# Patient Record
Sex: Female | Born: 1985 | Race: White | Hispanic: No | Marital: Married | Smoking: Never smoker
Health system: Southern US, Community
[De-identification: ages and names within clinical notes are randomized; demographics above are authoritative.]

## PROBLEM LIST (undated history)

## (undated) ENCOUNTER — Inpatient Hospital Stay (HOSPITAL_COMMUNITY): Payer: Self-pay

## (undated) DIAGNOSIS — Z789 Other specified health status: Secondary | ICD-10-CM

---

## 2013-07-14 ENCOUNTER — Other Ambulatory Visit (HOSPITAL_COMMUNITY): Payer: Self-pay | Admitting: Physician Assistant

## 2013-07-14 DIAGNOSIS — O26849 Uterine size-date discrepancy, unspecified trimester: Secondary | ICD-10-CM

## 2013-07-14 LAB — OB RESULTS CONSOLE ABO/RH: RH Type: POSITIVE

## 2013-07-14 LAB — OB RESULTS CONSOLE GC/CHLAMYDIA
Chlamydia: NEGATIVE
Gonorrhea: NEGATIVE

## 2013-07-14 LAB — OB RESULTS CONSOLE HIV ANTIBODY (ROUTINE TESTING): HIV: NONREACTIVE

## 2013-07-14 LAB — OB RESULTS CONSOLE ANTIBODY SCREEN: Antibody Screen: NEGATIVE

## 2013-07-14 LAB — OB RESULTS CONSOLE HEPATITIS B SURFACE ANTIGEN: Hepatitis B Surface Ag: NEGATIVE

## 2013-07-14 LAB — OB RESULTS CONSOLE RUBELLA ANTIBODY, IGM: RUBELLA: IMMUNE

## 2013-07-14 LAB — OB RESULTS CONSOLE RPR: RPR: NONREACTIVE

## 2013-07-15 ENCOUNTER — Other Ambulatory Visit (HOSPITAL_COMMUNITY): Payer: Self-pay | Admitting: Physician Assistant

## 2013-07-15 DIAGNOSIS — R1011 Right upper quadrant pain: Secondary | ICD-10-CM

## 2013-07-20 ENCOUNTER — Ambulatory Visit (HOSPITAL_COMMUNITY): Payer: Self-pay

## 2013-07-21 ENCOUNTER — Other Ambulatory Visit (HOSPITAL_COMMUNITY): Payer: Self-pay | Admitting: Physician Assistant

## 2013-07-21 DIAGNOSIS — Z3682 Encounter for antenatal screening for nuchal translucency: Secondary | ICD-10-CM

## 2013-07-25 ENCOUNTER — Ambulatory Visit (HOSPITAL_COMMUNITY): Payer: Medicaid Other

## 2013-07-25 ENCOUNTER — Ambulatory Visit (HOSPITAL_COMMUNITY)
Admission: RE | Admit: 2013-07-25 | Discharge: 2013-07-25 | Disposition: A | Discharge status: Home / Self Care | Payer: Medicaid Other | Source: Ambulatory Visit | Attending: Physician Assistant | Admitting: Physician Assistant

## 2013-07-25 ENCOUNTER — Encounter (HOSPITAL_COMMUNITY): Payer: Self-pay

## 2013-07-25 ENCOUNTER — Other Ambulatory Visit (HOSPITAL_COMMUNITY): Payer: Self-pay | Admitting: Physician Assistant

## 2013-07-25 DIAGNOSIS — O34219 Maternal care for unspecified type scar from previous cesarean delivery: Secondary | ICD-10-CM | POA: Insufficient documentation

## 2013-07-25 DIAGNOSIS — Z3689 Encounter for other specified antenatal screening: Secondary | ICD-10-CM | POA: Insufficient documentation

## 2013-07-25 DIAGNOSIS — Z3682 Encounter for antenatal screening for nuchal translucency: Secondary | ICD-10-CM

## 2013-07-25 DIAGNOSIS — O09299 Supervision of pregnancy with other poor reproductive or obstetric history, unspecified trimester: Secondary | ICD-10-CM | POA: Insufficient documentation

## 2013-07-25 DIAGNOSIS — O3660X0 Maternal care for excessive fetal growth, unspecified trimester, not applicable or unspecified: Secondary | ICD-10-CM | POA: Insufficient documentation

## 2013-08-09 ENCOUNTER — Encounter (HOSPITAL_COMMUNITY): Payer: Self-pay

## 2013-08-11 ENCOUNTER — Ambulatory Visit (HOSPITAL_COMMUNITY)
Admission: RE | Admit: 2013-08-11 | Discharge: 2013-08-11 | Disposition: A | Discharge status: Home / Self Care | Payer: Medicaid Other | Source: Ambulatory Visit | Attending: Physician Assistant | Admitting: Physician Assistant

## 2013-08-11 DIAGNOSIS — R1011 Right upper quadrant pain: Secondary | ICD-10-CM

## 2013-08-18 ENCOUNTER — Other Ambulatory Visit (HOSPITAL_COMMUNITY): Payer: Self-pay | Admitting: Family

## 2013-08-18 DIAGNOSIS — Z0489 Encounter for examination and observation for other specified reasons: Secondary | ICD-10-CM

## 2013-08-22 ENCOUNTER — Ambulatory Visit (HOSPITAL_COMMUNITY): Admission: RE | Admit: 2013-08-22 | Payer: Medicaid Other | Source: Ambulatory Visit

## 2013-08-22 ENCOUNTER — Ambulatory Visit (HOSPITAL_COMMUNITY): Payer: Medicaid Other

## 2013-08-26 ENCOUNTER — Other Ambulatory Visit (HOSPITAL_COMMUNITY): Payer: Self-pay | Admitting: Family

## 2013-08-26 ENCOUNTER — Ambulatory Visit (HOSPITAL_COMMUNITY): Admission: RE | Admit: 2013-08-26 | Payer: Medicaid Other | Source: Ambulatory Visit

## 2013-08-26 ENCOUNTER — Ambulatory Visit (HOSPITAL_COMMUNITY)
Admission: RE | Admit: 2013-08-26 | Discharge: 2013-08-26 | Disposition: A | Discharge status: Home / Self Care | Payer: Medicaid Other | Source: Ambulatory Visit | Attending: Family | Admitting: Family

## 2013-08-26 DIAGNOSIS — Z3689 Encounter for other specified antenatal screening: Secondary | ICD-10-CM | POA: Insufficient documentation

## 2013-08-26 DIAGNOSIS — O34219 Maternal care for unspecified type scar from previous cesarean delivery: Secondary | ICD-10-CM | POA: Insufficient documentation

## 2013-08-26 DIAGNOSIS — O09299 Supervision of pregnancy with other poor reproductive or obstetric history, unspecified trimester: Secondary | ICD-10-CM | POA: Insufficient documentation

## 2013-08-26 DIAGNOSIS — Z0489 Encounter for examination and observation for other specified reasons: Secondary | ICD-10-CM

## 2013-08-26 DIAGNOSIS — O3660X0 Maternal care for excessive fetal growth, unspecified trimester, not applicable or unspecified: Secondary | ICD-10-CM | POA: Insufficient documentation

## 2013-08-29 ENCOUNTER — Emergency Department (HOSPITAL_COMMUNITY)
Admission: EM | Admit: 2013-08-29 | Discharge: 2013-08-29 | Disposition: A | Discharge status: Home / Self Care | Payer: Medicaid Other | Source: Home / Self Care | Attending: Emergency Medicine | Admitting: Emergency Medicine

## 2013-08-29 ENCOUNTER — Encounter (HOSPITAL_COMMUNITY): Payer: Self-pay | Admitting: Emergency Medicine

## 2013-08-29 DIAGNOSIS — J069 Acute upper respiratory infection, unspecified: Secondary | ICD-10-CM

## 2013-08-29 MED ORDER — AZITHROMYCIN 250 MG PO TABS: ORAL_TABLET | ORAL | Status: DC

## 2013-08-29 MED ORDER — LORATADINE-PSEUDOEPHEDRINE ER 10-240 MG PO TB24: 1.0000 | ORAL_TABLET | Freq: Every day | ORAL | Status: DC

## 2013-08-29 NOTE — ED Notes (Signed)
C/o cough and congestion for five days now Denies any fever, chills, diarrhea, and constipation States the mucous is brownish  Patient is pregnant

## 2013-08-29 NOTE — ED Provider Notes (Signed)
Chief Complaint:   Chief Complaint  Patient presents with  . Cough  . Nasal Congestion    History of Present Illness:   Monica Bean is a 27 year old female who is 4 months pregnant. She has had a four-day history of a cough productive of brown sputum, chest tightness, aching in her ribs, nasal congestion, and right ear discomfort. She denies any fever, chills, shortness of breath, sore throat, or GI symptoms. There have been no pregnancy complications.  Review of Systems:  Other than noted above, the patient denies any of the following symptoms: Systemic:  No fevers, chills, sweats, weight loss or gain, fatigue, or tiredness. Eye:  No redness or discharge. ENT:  No ear pain, drainage, headache, nasal congestion, drainage, sinus pressure, difficulty swallowing, or sore throat. Neck:  No neck pain or swollen glands. Lungs:  No cough, sputum production, hemoptysis, wheezing, chest tightness, shortness of breath or chest pain. GI:  No abdominal pain, nausea, vomiting or diarrhea.  PMFSH:  Past medical history, family history, social history, meds, and allergies were reviewed.   Physical Exam:   Vital signs:  BP 99/66  Pulse 90  Temp(Src) 97.7 F (36.5 C) (Oral)  Resp 16  SpO2 100% General:  Alert and oriented.  In no distress.  Skin warm and dry. Eye:  No conjunctival injection or drainage. Lids were normal. ENT:  TMs and canals were normal, without erythema or inflammation.  Nasal mucosa was clear and uncongested, without drainage.  Mucous membranes were moist.  Pharynx was clear with no exudate or drainage.  There were no oral ulcerations or lesions. Neck:  Supple, no adenopathy, tenderness or mass. Lungs:  No respiratory distress.  Lungs were clear to auscultation, without wheezes, rales or rhonchi.  Breath sounds were clear and equal bilaterally.  Heart:  Regular rhythm, without gallops, murmers or rubs. Skin:  Clear, warm, and dry, without rash or lesions.  Assessment:  The  encounter diagnosis was Viral upper respiratory infection.  Plan:   1.  Meds:  The following meds were prescribed:   Discharge Medication List as of 08/29/2013  2:14 PM    START taking these medications   Details  azithromycin (ZITHROMAX Z-PAK) 250 MG tablet Take as directed., Print    loratadine-pseudoephedrine (CLARITIN-D 24 HOUR) 10-240 MG per 24 hr tablet Take 1 tablet by mouth daily., Starting 08/29/2013, Until Discontinued, Normal        2.  Patient Education/Counseling:  The patient was given appropriate handouts, self care instructions, and instructed in symptomatic relief.  For right now can use Claritin-D, throat lozenges, and hot salt water gargles as well as saline nasal spray. If no better in 2-3 days, may start a Z-Pak.  3.  Follow up:  The patient was told to follow up if no better in 3 to 4 days, if becoming worse in any way, and given some red flag symptoms such as fever or difficulty breathing which would prompt immediate return.  Follow up here as necessary.      Reuben Likes, MD 08/29/13 [phone]

## 2013-09-29 NOTE — L&D Delivery Note (Signed)
[facility]  Labor Events:   Preterm labor:   Rupture date:   Rupture time:   Rupture type: Artificial  Fluid Color: Light Meconium  Induction:   Augmentation:   Complications:   Cervical ripening:          Delivery:   Episiotomy:   Lacerations:   Repair suture:   Repair # of packets:   Blood loss (ml): 400   Information for the patient's newborn:  Monica Bean, Girl [name] [[id number]][[id number]]    Delivery 01/19/2014 6:13 PM by  VBAC, Vacuum Assisted Sex:  female Gestational Age: <None> Delivery Clinician:  Adam PhenixJames G Hope Holst Living?: Yes        APGARS  One minute Five minutes Ten minutes  Skin color: 0   1      Heart rate: 2   2      Grimace: 1   1      Muscle tone: 0   1      Breathing: 0   2      Totals: 3  7      Presentation/position: Vertex  Left Occiput Anterior Resuscitation: See NICU Consult (infant's chart)  Cord information: 3 vessels   Disposition of cord blood: No    Blood gases sent? Yes Complications: None  Placenta: Delivered: 01/19/2014 6:47 PM  Spontaneous  Intact appearance Newborn Measurements: Weight:   Height: 23.23"  Head circumference:   Chest circumference:   Other providers: Delivery Nurse Registered Nurse Registered Nurse Registered Nurse Technician Tobias AlexanderHeather Jensen Krietemeyer Melissa A Wilkins Doloris Hallhristine Marie Soliz Shannon Kaley Earl Audrey Elaine Edwards  Additional  information: Forceps:   Vacuum: Outlet  Breech:   Observed anomalies       Kiwi vacuum, 2 pop-off. 3rd degree laceration repair 3-0 vicryl. Patient counseled prior to vacuum attempt and gave verbal consent. Weight 8 lb 11.3 oz Bottle feed  Adam PhenixJames G Larnell Granlund, MD 01/19/2014 7:17 PM

## 2013-10-13 ENCOUNTER — Other Ambulatory Visit: Payer: Self-pay | Admitting: Family Medicine

## 2013-10-13 DIAGNOSIS — N63 Unspecified lump in unspecified breast: Secondary | ICD-10-CM

## 2013-11-01 ENCOUNTER — Ambulatory Visit
Admission: RE | Admit: 2013-11-01 | Discharge: 2013-11-01 | Disposition: A | Discharge status: Home / Self Care | Payer: Medicaid Other | Source: Ambulatory Visit | Attending: Family Medicine | Admitting: Family Medicine

## 2013-11-01 DIAGNOSIS — N63 Unspecified lump in unspecified breast: Secondary | ICD-10-CM

## 2013-12-19 ENCOUNTER — Other Ambulatory Visit (HOSPITAL_COMMUNITY): Payer: Self-pay | Admitting: Nurse Practitioner

## 2013-12-19 DIAGNOSIS — O321XX0 Maternal care for breech presentation, not applicable or unspecified: Secondary | ICD-10-CM

## 2013-12-21 LAB — OB RESULTS CONSOLE GBS: GBS: NEGATIVE

## 2013-12-23 ENCOUNTER — Ambulatory Visit (HOSPITAL_COMMUNITY)
Admission: RE | Admit: 2013-12-23 | Discharge: 2013-12-23 | Disposition: A | Discharge status: Home / Self Care | Payer: Medicaid Other | Source: Ambulatory Visit | Attending: Nurse Practitioner | Admitting: Nurse Practitioner

## 2013-12-23 DIAGNOSIS — O34219 Maternal care for unspecified type scar from previous cesarean delivery: Secondary | ICD-10-CM | POA: Insufficient documentation

## 2013-12-23 DIAGNOSIS — Z363 Encounter for antenatal screening for malformations: Secondary | ICD-10-CM | POA: Insufficient documentation

## 2013-12-23 DIAGNOSIS — Z1389 Encounter for screening for other disorder: Secondary | ICD-10-CM | POA: Insufficient documentation

## 2013-12-23 DIAGNOSIS — O3660X0 Maternal care for excessive fetal growth, unspecified trimester, not applicable or unspecified: Secondary | ICD-10-CM | POA: Insufficient documentation

## 2013-12-23 DIAGNOSIS — O321XX0 Maternal care for breech presentation, not applicable or unspecified: Secondary | ICD-10-CM

## 2014-01-16 ENCOUNTER — Inpatient Hospital Stay (HOSPITAL_COMMUNITY): Payer: Medicaid Other

## 2014-01-16 ENCOUNTER — Inpatient Hospital Stay (HOSPITAL_COMMUNITY)
Admission: AD | Admit: 2014-01-16 | Discharge: 2014-01-16 | Disposition: A | Discharge status: Home / Self Care | Payer: Medicaid Other | Source: Ambulatory Visit | Attending: Obstetrics & Gynecology | Admitting: Obstetrics & Gynecology

## 2014-01-16 ENCOUNTER — Encounter (HOSPITAL_COMMUNITY): Payer: Self-pay | Admitting: *Deleted

## 2014-01-16 DIAGNOSIS — R748 Abnormal levels of other serum enzymes: Secondary | ICD-10-CM

## 2014-01-16 DIAGNOSIS — O9989 Other specified diseases and conditions complicating pregnancy, childbirth and the puerperium: Secondary | ICD-10-CM | POA: Insufficient documentation

## 2014-01-16 DIAGNOSIS — O99719 Diseases of the skin and subcutaneous tissue complicating pregnancy, unspecified trimester: Secondary | ICD-10-CM

## 2014-01-16 DIAGNOSIS — L299 Pruritus, unspecified: Secondary | ICD-10-CM | POA: Insufficient documentation

## 2014-01-16 DIAGNOSIS — R21 Rash and other nonspecific skin eruption: Secondary | ICD-10-CM | POA: Insufficient documentation

## 2014-01-16 HISTORY — DX: Other specified health status: Z78.9

## 2014-01-16 LAB — COMPREHENSIVE METABOLIC PANEL
ALT: 65 U/L — AB (ref 0–35)
AST: 39 U/L — AB (ref 0–37)
Albumin: 2.4 g/dL — ABNORMAL LOW (ref 3.5–5.2)
Alkaline Phosphatase: 242 U/L — ABNORMAL HIGH (ref 39–117)
BUN: 8 mg/dL (ref 6–23)
CO2: 20 meq/L (ref 19–32)
CREATININE: 0.57 mg/dL (ref 0.50–1.10)
Calcium: 9.4 mg/dL (ref 8.4–10.5)
Chloride: 105 mEq/L (ref 96–112)
GFR calc Af Amer: >90 mL/min (ref >90)
GLUCOSE: 92 mg/dL (ref 70–99)
Potassium: 4.5 mEq/L (ref 3.7–5.3)
SODIUM: 139 meq/L (ref 137–147)
Total Bilirubin: 0.5 mg/dL (ref 0.3–1.2)
Total Protein: 6.1 g/dL (ref 6.0–8.3)

## 2014-01-16 LAB — CBC WITH DIFFERENTIAL/PLATELET
Basophils Absolute: 0 10*3/uL (ref 0.0–0.1)
Basophils Relative: 0 % (ref 0–1)
EOS PCT: 1 % (ref 0–5)
Eosinophils Absolute: 0.1 10*3/uL (ref 0.0–0.7)
HEMATOCRIT: 38.8 % (ref 36.0–46.0)
Hemoglobin: 13.4 g/dL (ref 12.0–15.0)
LYMPHS ABS: 3.4 10*3/uL (ref 0.7–4.0)
LYMPHS PCT: 28 % (ref 12–46)
MCH: 30.7 pg (ref 26.0–34.0)
MCHC: 34.5 g/dL (ref 30.0–36.0)
MCV: 89 fL (ref 78.0–100.0)
MONO ABS: 0.9 10*3/uL (ref 0.1–1.0)
Monocytes Relative: 7 % (ref 3–12)
NEUTROS ABS: 7.9 10*3/uL — AB (ref 1.7–7.7)
Neutrophils Relative %: 64 % (ref 43–77)
Platelets: 278 10*3/uL (ref 150–400)
RBC: 4.36 MIL/uL (ref 3.87–5.11)
RDW: 14.2 % (ref 11.5–15.5)
WBC: 12.3 10*3/uL — AB (ref 4.0–10.5)

## 2014-01-16 MED ORDER — HYDROXYZINE PAMOATE 50 MG PO CAPS: 50.0000 mg | ORAL_CAPSULE | Freq: Three times a day (TID) | ORAL | Status: AC | PRN

## 2014-01-16 NOTE — MAU Note (Signed)
Patient presents with complaint of a rash covering her body X 2 weeks that itches.

## 2014-01-16 NOTE — Discharge Instructions (Signed)
Cholestasis of Pregnancy  Cholestasis refers to any condition where the flow of a digestive fluid (bile) in the gallbladder slows or stops. Cholestasis can develop during pregnancy because it is thought that pregnancy hormones affect how the gallbladder functions. It usually develops during the third trimester of pregnancy, but it can occur during any trimester. Often, it goes away soon after the baby is born. For the mother, it is usually uncomfortable but harmless. However, it can increase the risk for fetal distress, preterm birth, or fetal death. Early delivery may be recommended.   CAUSES   It is thought that pregnancy hormones may be the cause since the hormones may affect how the gallbladder functions.   SYMPTOMS   · Intense itching, especially on the palms of the hands and soles of the feet. Itching can spread to the trunk of the body.  · Rash.    · Yellowing of the skin and eyes (jaundice).    · Dark urine.    · Light-colored stools.    DIAGNOSIS   Your health care provider will take your history and perform a physical exam. Blood tests may be performed to check your liver function, check your level of bile salts, and check for the chemical bilirubin. An ultrasonography (commonly called ultrasound) of your liver may be done to check for abnormalities.   TREATMENT   A medicine may be given to control the itching and help the liver to function normally because bile is produced by the liver. If the problem is severe and the jaundice reaches dangerous levels, labor may be induced or a cesarean delivery may be performed to prevent serious medical problems for the baby.   HOME CARE INSTRUCTIONS   · Only take medicines or use anti-itch creams as directed by your health care provider.    · Take a cool bath and add 2 3 tbs (30 45 g) of cornstarch to the water to soothe the itching or rash.    · Keep all follow-up appointments as directed by your health care provider.    · Keep your fingernails short to prevent skin  irritations caused from scratching.    SEEK MEDICAL CARE IF:   Your symptoms get worse even with treatment.  Document Released: 09/12/2000 Document Revised: 05/18/2013 Document Reviewed: 01/03/2013  ExitCare® Patient Information ©2014 [employer].

## 2014-01-16 NOTE — MAU Provider Note (Signed)
Chief Complaint:  Rash   None     HPI: Monica Bean is a 28 y.o. G2P1001 at 5381w1d pt of [facility] who presents to maternity admissions reporting generalized itching x2 weeks.  She reports itching is on arms, legs, torso, and face. She reports small bumps like rash on arms and hands, but no rash on abdomen, legs or feet but itching is severe in all locations.  She [place] for pre[place] and failure to progress with report of 3kg [place].  [facility] Prenatal lists infant weight as 9lb 14 oz but pt reports infant did not weight this amount and was smaller.  She desires TOLAC with this pregnancy and has signed consent (paperwork with prenatal in chart).  She reports good fetal movement, denies regular contractions, LOF, vaginal bleeding, vaginal itching/burning, urinary symptoms, h/a, dizziness, n/v, or fever/chills.     Past Medical History: Past Medical History  Diagnosis Date  . Medical history non-contributory     Past obstetric history: OB History  Gravida Para Term Preterm AB SAB TAB Ectopic Multiple Living  2 1 1  0 0 0 0 0 0 1    # Outcome Date GA Lbr Len/2nd Weight Sex Delivery Anes PTL Lv  2 CUR           1 TRM 2009 7176w0d  4.479 kg (9 lb 14 oz) M CS  N Y      Past Surgical History: Past Surgical History  Procedure Laterality Date  . Cesarean section      Family History: History reviewed. No pertinent family history.  Social History: History  Substance Use Topics  . Smoking status: Never Smoker   . Smokeless tobacco: Never Used  . Alcohol Use: No    Allergies: No Known Allergies  Meds:  Prescriptions prior to admission  Medication Sig Dispense Refill  . Prenatal Vit-Fe Fumarate-FA (PRENATAL MULTIVITAMIN) TABS tablet Take 1 tablet by mouth daily at 12 noon.        ROS: Pertinent findings in history of present illness.  Physical Exam  Blood pressure 114/79, pulse 83, temperature 98.4 F (36.9 C), temperature source Oral, resp. rate 16,  height 5\' 2"  (1.575 m), weight 75.751 kg (167 lb). GENERAL: Well-developed, well-nourished female in no acute distress.  HEENT: normocephalic HEART: normal rate RESP: normal effort ABDOMEN: Soft, non-tender, gravid appropriate for gestational age EXTREMITIES: Nontender, no edema NEURO: alert and oriented    FHT:  Baseline 150, moderate variability, accelerations present, no decelerations Contractions: None on toco or to palpation   Labs: Results for orders placed during the hospital encounter of 01/16/14 (from the past 24 hour(s))  CBC WITH DIFFERENTIAL     Status: Abnormal   Collection Time    01/16/14  9:40 AM      Result Value Ref Range   WBC 12.3 (*) 4.0 - 10.5 K/uL   RBC 4.36  3.87 - 5.11 MIL/uL   Hemoglobin 13.4  12.0 - 15.0 g/dL   HCT 47.438.8  25.936.0 - 56.346.0 %   MCV 89.0  78.0 - 100.0 fL   MCH 30.7  26.0 - 34.0 pg   MCHC 34.5  30.0 - 36.0 g/dL   RDW 87.514.2  64.311.5 - 32.915.5 %   Platelets 278  150 - 400 K/uL   Neutrophils Relative % 64  43 - 77 %   Neutro Abs 7.9 (*) 1.7 - 7.7 K/uL   Lymphocytes Relative 28  12 - 46 %  Lymphs Abs 3.4  0.7 - 4.0 K/uL   Monocytes Relative 7  3 - 12 %   Monocytes Absolute 0.9  0.1 - 1.0 K/uL   Eosinophils Relative 1  0 - 5 %   Eosinophils Absolute 0.1  0.0 - 0.7 K/uL   Basophils Relative 0  0 - 1 %   Basophils Absolute 0.0  0.0 - 0.1 K/uL  COMPREHENSIVE METABOLIC PANEL     Status: Abnormal   Collection Time    01/16/14  9:40 AM      Result Value Ref Range   Sodium 139  137 - 147 mEq/L   Potassium 4.5  3.7 - 5.3 mEq/L   Chloride 105  96 - 112 mEq/L   CO2 20  19 - 32 mEq/L   Glucose, Bld 92  70 - 99 mg/dL   BUN 8  6 - 23 mg/dL   Creatinine, Ser 0.450.57  0.50 - 1.10 mg/dL   Calcium 9.4  8.4 - 40.910.5 mg/dL   Total Protein 6.1  6.0 - 8.3 g/dL   Albumin 2.4 (*) 3.5 - 5.2 g/dL   AST 39 (*) 0 - 37 U/L   ALT 65 (*) 0 - 35 U/L   Alkaline Phosphatase 242 (*) 39 - 117 U/L   Total Bilirubin 0.5  0.3 - 1.2 mg/dL   GFR calc non Af Amer >90  >90 mL/min    GFR calc Af Amer >90  >90 mL/min    Imaging:  AFI 9.93, largest pocket 3.22 cm on preliminary U/S report, NST reactive so normal modified BPP results today    Assessment: 1. Pregnancy pruritus   2. Elevated liver enzymes     Plan: Consult Dr Penne LashLeggett Bile acid lab pending--call pt with results today. IOL tomorrow if elevated. Discharge home Labor precautions and fetal kick counts Vistaril 50 mg TID prn itching Phone number verified-- will call pt today with results Return to MAU as needed     Follow-up Information   [facility] [place] Today. (The MAU will call you today with your lab results.  If normal, keep your next prenatal appointment.  )    Contact information:   [address] [place] [place] [id number] [phone]       Medication List         hydrOXYzine 50 MG capsule  Commonly known as:  VISTARIL  Take 1 capsule (50 mg total) by mouth 3 (three) times daily as needed.     prenatal multivitamin Tabs tablet  Take 1 tablet by mouth daily at 12 noon.        Sharen CounterLisa Leftwich-Kirby Certified Nurse-Midwife 01/16/2014 2:05 PM

## 2014-01-17 LAB — BILE ACIDS, TOTAL: Bile Acids Total: 21 umol/L — ABNORMAL HIGH (ref 0–19)

## 2014-01-18 ENCOUNTER — Inpatient Hospital Stay (HOSPITAL_COMMUNITY)
Admission: RE | Admit: 2014-01-18 | Discharge: 2014-01-21 | DRG: 774 | Disposition: A | Discharge status: Home / Self Care | Payer: Medicaid Other | Source: Ambulatory Visit | Attending: Obstetrics and Gynecology | Admitting: Obstetrics and Gynecology

## 2014-01-18 ENCOUNTER — Encounter (HOSPITAL_COMMUNITY): Payer: Self-pay | Admitting: *Deleted

## 2014-01-18 ENCOUNTER — Telehealth (HOSPITAL_COMMUNITY): Payer: Self-pay | Admitting: *Deleted

## 2014-01-18 ENCOUNTER — Encounter (HOSPITAL_COMMUNITY): Payer: Self-pay

## 2014-01-18 ENCOUNTER — Telehealth: Payer: Self-pay | Admitting: *Deleted

## 2014-01-18 DIAGNOSIS — O411090 Infection of amniotic sac and membranes, unspecified, unspecified trimester, not applicable or unspecified: Secondary | ICD-10-CM | POA: Diagnosis present

## 2014-01-18 DIAGNOSIS — O26619 Liver and biliary tract disorders in pregnancy, unspecified trimester: Principal | ICD-10-CM | POA: Diagnosis present

## 2014-01-18 DIAGNOSIS — K838 Other specified diseases of biliary tract: Secondary | ICD-10-CM | POA: Diagnosis present

## 2014-01-18 DIAGNOSIS — O34219 Maternal care for unspecified type scar from previous cesarean delivery: Secondary | ICD-10-CM | POA: Diagnosis present

## 2014-01-18 DIAGNOSIS — Z3490 Encounter for supervision of normal pregnancy, unspecified, unspecified trimester: Secondary | ICD-10-CM

## 2014-01-18 LAB — CBC
HCT: 38.5 % (ref 36.0–46.0)
HEMOGLOBIN: 13.3 g/dL (ref 12.0–15.0)
MCH: 30.6 pg (ref 26.0–34.0)
MCHC: 34.5 g/dL (ref 30.0–36.0)
MCV: 88.7 fL (ref 78.0–100.0)
PLATELETS: 261 10*3/uL (ref 150–400)
RBC: 4.34 MIL/uL (ref 3.87–5.11)
RDW: 14.1 % (ref 11.5–15.5)
WBC: 13.6 10*3/uL — ABNORMAL HIGH (ref 4.0–10.5)

## 2014-01-18 LAB — TYPE AND SCREEN
ABO/RH(D): A POS
Antibody Screen: NEGATIVE

## 2014-01-18 LAB — ABO/RH: ABO/RH(D): A POS

## 2014-01-18 MED ORDER — ONDANSETRON HCL 4 MG/2ML IJ SOLN: 4.0000 mg | Freq: Four times a day (QID) | INTRAMUSCULAR | Status: DC | PRN

## 2014-01-18 MED ORDER — LACTATED RINGERS IV SOLN: 500.0000 mL | INTRAVENOUS | Status: DC | PRN

## 2014-01-18 MED ORDER — LACTATED RINGERS IV SOLN
INTRAVENOUS | Status: DC
  Administered 2014-01-18 – 2014-01-19 (×4): via INTRAVENOUS

## 2014-01-18 MED ORDER — CITRIC ACID-SODIUM CITRATE 334-500 MG/5ML PO SOLN
30.0000 mL | ORAL | Status: DC | PRN
  Filled 2014-01-18: qty 15

## 2014-01-18 MED ORDER — TERBUTALINE SULFATE 1 MG/ML IJ SOLN: 0.2500 mg | Freq: Once | INTRAMUSCULAR | Status: AC | PRN

## 2014-01-18 MED ORDER — FLEET ENEMA 7-19 GM/118ML RE ENEM: 1.0000 | ENEMA | Freq: Every day | RECTAL | Status: DC | PRN

## 2014-01-18 MED ORDER — LIDOCAINE HCL (PF) 1 % IJ SOLN
30.0000 mL | INTRAMUSCULAR | Status: DC | PRN
  Filled 2014-01-18: qty 30

## 2014-01-18 MED ORDER — IBUPROFEN 600 MG PO TABS
600.0000 mg | ORAL_TABLET | Freq: Four times a day (QID) | ORAL | Status: DC | PRN
  Administered 2014-01-19: 600 mg via ORAL
  Filled 2014-01-18: qty 1

## 2014-01-18 MED ORDER — OXYTOCIN 40 UNITS IN LACTATED RINGERS INFUSION - SIMPLE MED
1.0000 m[IU]/min | INTRAVENOUS | Status: DC
  Administered 2014-01-19: 2 m[IU]/min via INTRAVENOUS
  Filled 2014-01-18 (×2): qty 1000

## 2014-01-18 MED ORDER — OXYTOCIN BOLUS FROM INFUSION: 500.0000 mL | INTRAVENOUS | Status: DC

## 2014-01-18 MED ORDER — FENTANYL CITRATE 0.05 MG/ML IJ SOLN
100.0000 ug | INTRAMUSCULAR | Status: DC | PRN
  Administered 2014-01-19 (×3): 100 ug via INTRAVENOUS
  Filled 2014-01-18 (×3): qty 2

## 2014-01-18 MED ORDER — ACETAMINOPHEN 325 MG PO TABS
650.0000 mg | ORAL_TABLET | ORAL | Status: DC | PRN
  Administered 2014-01-19: 650 mg via ORAL
  Filled 2014-01-18: qty 2

## 2014-01-18 MED ORDER — DIPHENHYDRAMINE HCL 25 MG PO CAPS: 25.0000 mg | ORAL_CAPSULE | ORAL | Status: DC | PRN

## 2014-01-18 MED ORDER — OXYCODONE-ACETAMINOPHEN 5-325 MG PO TABS: 1.0000 | ORAL_TABLET | ORAL | Status: DC | PRN

## 2014-01-18 MED ORDER — OXYTOCIN 40 UNITS IN LACTATED RINGERS INFUSION - SIMPLE MED
62.5000 mL/h | INTRAVENOUS | Status: DC
  Administered 2014-01-19: 999 mL/h via INTRAVENOUS

## 2014-01-18 NOTE — H&P (Signed)
LABOR ADMISSION HISTORY AND PHYSICAL  Monica Bean is a 28 y.o. female G2P1001 with IUP at 527w3d by 15 week US presenting for IOL for cholestasis. States had 2 weeks of generalized itching and came to MAU for evaluation.  Her bile salts were 21, AST 39, ALT 65.  Thus she was called and set up for induction.  She says other than itching she is doing well.  No regular ctx, lof, vb.  +FM.  Has a hx of a c/s and states it was done at 38 weeks for pre-eclampsia but she did not labor- it was scheduled.  Baby was 3.5kg (7.7lbs). Desires TOLAC.    [facility] Uncomplicated until recently as above.    Prenatal History/Complications:  Past Medical History: Past Medical History  Diagnosis Date  . Medical history non-contributory     Past Surgical History: Past Surgical History  Procedure Laterality Date  . Cesarean section      Obstetrical History: OB History   Grav Para Term Preterm Abortions TAB SAB Ect Mult Living   2 1 1  0 0 0 0 0 0 1        Social History: History   Social History  . Marital Status: Married    Spouse Name: N/A    Number of Children: N/A  . Years of Education: N/A   Social History Main Topics  . Smoking status: Never Smoker   . Smokeless tobacco: Never Used  . Alcohol Use: No  . Drug Use: No  . Sexual Activity: Yes    Birth Control/ Protection: None   Other Topics Concern  . None   Social History Narrative  . None    Family History: History reviewed. No pertinent family history.  Allergies: No Known Allergies  Prescriptions prior to admission  Medication Sig Dispense Refill  . hydrOXYzine (VISTARIL) 50 MG capsule Take 1 capsule (50 mg total) by mouth 3 (three) times daily as needed.  30 capsule  0  . Prenatal Vit-Fe Fumarate-FA (PRENATAL MULTIVITAMIN) TABS tablet Take 1 tablet by mouth daily at 12 noon.         Review of Systems   All systems reviewed and negative except as stated in HPI  Blood pressure 123/82, pulse 93,  temperature 98.5 F (36.9 C), temperature source Oral, resp. rate 18, height 5\' 2"  (1.575 m), weight 75.751 kg (167 lb). General appearance: alert, cooperative and no distress Lungs: clear to auscultation bilaterally Heart: regular rate and rhythm Abdomen: soft, non-tender; bowel sounds normal Extremities: Homans sign is negative, no sign of DVT  Presentation: cephalic Fetal monitoringBaseline: 145 bpm, Variability: Good {> 6 bpm), Accelerations: Reactive and Decelerations: Absent Uterine activity occasional   Dilation: 1.5 Effacement (%): 60 Station: -1 Exam by:: Bryanne Riquelme MD   Prenatal labs: ABO, Rh: A/Positive/-- (10/16 0000) Antibody: Negative (10/16 0000) Rubella:   RPR: Nonreactive (10/16 0000)  HBsAg: Negative (10/16 0000)  HIV: Non-reactive (10/16 0000)  GBS: Negative (03/25 0000)  1 hr Glucola 112 Genetic screening  normal Anatomy US normal    Prenatal Transfer Tool  Maternal Diabetes: No Genetic Screening: Normal Maternal Ultrasounds/Referrals: Normal Fetal Ultrasounds or other Referrals:  None Maternal Substance Abuse:  No Significant Maternal Medications:  None Significant Maternal Lab Results: None     Results for orders placed during the hospital encounter of 01/18/14 (from the past 24 hour(s))  CBC   Collection Time    01/18/14  7:50 PM      Result Value Ref Range  WBC 13.6 (*) 4.0 - 10.5 K/uL   RBC 4.34  3.87 - 5.11 MIL/uL   Hemoglobin 13.3  12.0 - 15.0 g/dL   HCT 16.138.5  09.636.0 - 04.546.0 %   MCV 88.7  78.0 - 100.0 fL   MCH 30.6  26.0 - 34.0 pg   MCHC 34.5  30.0 - 36.0 g/dL   RDW 40.914.1  81.111.5 - 91.415.5 %   Platelets 261  150 - 400 K/uL    Assessment: Monica Bean is a 28 y.o. G2P1001 at 3552w3d here for IOL for cholestasis of pregnancy.     #Labor:- induction started with FB- placed and inflated with 60cc without complications.  #Pain: Plans for epidural in active labor.  #FWB: Cat I tracing #ID:  GBS neg #MOF: breast  #MOC: POPs #Circ:  n/a  Shital Crayton  L Collin Rengel 01/18/2014, 9:12 PM

## 2014-01-18 NOTE — Telephone Encounter (Signed)
Preadmission screen  

## 2014-01-18 NOTE — Progress Notes (Signed)
Dr. Reola CalkinsBeck ordered for me to check patient

## 2014-01-18 NOTE — Telephone Encounter (Addendum)
Message copied by Jill SideAY, Rashed Edler L on Wed Jan 18, 2014  7:39 AM ------      Message from: Sharen CounterLEFTWICH-KIRBY, LISA A      Created: Tue Jan 17, 2014  4:51 PM      Regarding: Pt needs IOL scheduled      Contact: [phone]       I saw this pt in MAU, she [facility] but came to MAU with itching x2 weeks.  Her bile acids are elevated, so she should be induced as soon as possible for cholestasis of pregnancy per Dr Penne LashLeggett.  Please schedule her induction and call the pt to let her know. Thank you.   ------  Called pt with [employer] # [phone] and informed her of abnormal lab results which requires the need for induction of labor. I explained that there is a problem with her liver function and to remain pregnant puts the baby at risk for possible problems. Pt states that she desires a vaginal delivery. I stated that we will be attempting a vaginal delivery however a c/section may become necessary if there are problems during the course of her labor. This would be done if a vaginal delivery was not considered to be safe for her or the baby. She was given appt for tonight @ 1930. All questions were answered to her satisfaction. She voiced understanding and agreement of plan of care. Total time spent talking with pt = 25 min.

## 2014-01-19 ENCOUNTER — Encounter (HOSPITAL_COMMUNITY): Payer: Medicaid Other | Admitting: Anesthesiology

## 2014-01-19 ENCOUNTER — Inpatient Hospital Stay (HOSPITAL_COMMUNITY): Payer: Medicaid Other | Admitting: Anesthesiology

## 2014-01-19 DIAGNOSIS — O480 Post-term pregnancy: Secondary | ICD-10-CM

## 2014-01-19 LAB — RPR

## 2014-01-19 MED ORDER — FENTANYL 2.5 MCG/ML BUPIVACAINE 1/10 % EPIDURAL INFUSION (WH - ANES)
INTRAMUSCULAR | Status: DC | PRN
  Administered 2014-01-19: 14 mL/h via EPIDURAL

## 2014-01-19 MED ORDER — FENTANYL 2.5 MCG/ML BUPIVACAINE 1/10 % EPIDURAL INFUSION (WH - ANES)
14.0000 mL/h | INTRAMUSCULAR | Status: DC | PRN
  Administered 2014-01-19: 14 mL/h via EPIDURAL
  Filled 2014-01-19 (×2): qty 125

## 2014-01-19 MED ORDER — MISOPROSTOL 200 MCG PO TABS
200.0000 ug | ORAL_TABLET | Freq: Once | ORAL | Status: AC
  Administered 2014-01-19: 200 ug via ORAL

## 2014-01-19 MED ORDER — MISOPROSTOL 200 MCG PO TABS
ORAL_TABLET | ORAL | Status: AC
  Filled 2014-01-19: qty 4

## 2014-01-19 MED ORDER — PHENYLEPHRINE 40 MCG/ML (10ML) SYRINGE FOR IV PUSH (FOR BLOOD PRESSURE SUPPORT)
80.0000 ug | PREFILLED_SYRINGE | INTRAVENOUS | Status: DC | PRN
  Filled 2014-01-19: qty 10
  Filled 2014-01-19: qty 2

## 2014-01-19 MED ORDER — SIMETHICONE 80 MG PO CHEW: 80.0000 mg | CHEWABLE_TABLET | ORAL | Status: DC | PRN

## 2014-01-19 MED ORDER — ZOLPIDEM TARTRATE 5 MG PO TABS: 5.0000 mg | ORAL_TABLET | Freq: Every evening | ORAL | Status: DC | PRN

## 2014-01-19 MED ORDER — GENTAMICIN SULFATE 40 MG/ML IJ SOLN
140.0000 mg | Freq: Three times a day (TID) | INTRAVENOUS | Status: DC
  Filled 2014-01-19: qty 3.5

## 2014-01-19 MED ORDER — LIDOCAINE HCL (PF) 1 % IJ SOLN
INTRAMUSCULAR | Status: DC | PRN
  Administered 2014-01-19 (×2): 8 mL

## 2014-01-19 MED ORDER — PHENYLEPHRINE 40 MCG/ML (10ML) SYRINGE FOR IV PUSH (FOR BLOOD PRESSURE SUPPORT)
80.0000 ug | PREFILLED_SYRINGE | INTRAVENOUS | Status: DC | PRN
  Filled 2014-01-19: qty 2

## 2014-01-19 MED ORDER — TETANUS-DIPHTH-ACELL PERTUSSIS 5-2.5-18.5 LF-MCG/0.5 IM SUSP
0.5000 mL | Freq: Once | INTRAMUSCULAR | Status: AC
  Administered 2014-01-20: 0.5 mL via INTRAMUSCULAR
  Filled 2014-01-19: qty 0.5

## 2014-01-19 MED ORDER — DIPHENHYDRAMINE HCL 25 MG PO CAPS: 25.0000 mg | ORAL_CAPSULE | Freq: Four times a day (QID) | ORAL | Status: DC | PRN

## 2014-01-19 MED ORDER — PRENATAL MULTIVITAMIN CH
1.0000 | ORAL_TABLET | Freq: Every day | ORAL | Status: DC
  Administered 2014-01-20: 1 via ORAL
  Filled 2014-01-19: qty 1

## 2014-01-19 MED ORDER — ACETAMINOPHEN 500 MG PO TABS
1000.0000 mg | ORAL_TABLET | ORAL | Status: AC
  Administered 2014-01-19: 1000 mg via ORAL
  Filled 2014-01-19: qty 2

## 2014-01-19 MED ORDER — EPHEDRINE 5 MG/ML INJ
10.0000 mg | INTRAVENOUS | Status: DC | PRN
  Filled 2014-01-19: qty 2

## 2014-01-19 MED ORDER — SENNOSIDES-DOCUSATE SODIUM 8.6-50 MG PO TABS
2.0000 | ORAL_TABLET | ORAL | Status: DC
Start: 2014-01-20 — End: 2014-01-21
  Administered 2014-01-20 – 2014-01-21 (×2): 2 via ORAL
  Filled 2014-01-19 (×2): qty 2

## 2014-01-19 MED ORDER — WITCH HAZEL-GLYCERIN EX PADS: 1.0000 "application " | MEDICATED_PAD | CUTANEOUS | Status: DC | PRN

## 2014-01-19 MED ORDER — LACTATED RINGERS IV SOLN: 500.0000 mL | Freq: Once | INTRAVENOUS | Status: DC

## 2014-01-19 MED ORDER — LANOLIN HYDROUS EX OINT: TOPICAL_OINTMENT | CUTANEOUS | Status: DC | PRN

## 2014-01-19 MED ORDER — IBUPROFEN 600 MG PO TABS
600.0000 mg | ORAL_TABLET | Freq: Four times a day (QID) | ORAL | Status: DC
  Administered 2014-01-20 – 2014-01-21 (×5): 600 mg via ORAL
  Filled 2014-01-19 (×6): qty 1

## 2014-01-19 MED ORDER — ONDANSETRON HCL 4 MG/2ML IJ SOLN: 4.0000 mg | INTRAMUSCULAR | Status: DC | PRN

## 2014-01-19 MED ORDER — ONDANSETRON HCL 4 MG PO TABS
4.0000 mg | ORAL_TABLET | ORAL | Status: DC | PRN
Start: 2014-01-19 — End: 2014-01-21

## 2014-01-19 MED ORDER — OXYCODONE-ACETAMINOPHEN 5-325 MG PO TABS
1.0000 | ORAL_TABLET | ORAL | Status: DC | PRN
  Administered 2014-01-19: 1 via ORAL
  Filled 2014-01-19: qty 2

## 2014-01-19 MED ORDER — EPHEDRINE 5 MG/ML INJ
10.0000 mg | INTRAVENOUS | Status: DC | PRN
  Filled 2014-01-19: qty 2
  Filled 2014-01-19: qty 4

## 2014-01-19 MED ORDER — DIBUCAINE 1 % RE OINT: 1.0000 "application " | TOPICAL_OINTMENT | RECTAL | Status: DC | PRN

## 2014-01-19 MED ORDER — BENZOCAINE-MENTHOL 20-0.5 % EX AERO
1.0000 "application " | INHALATION_SPRAY | CUTANEOUS | Status: DC | PRN
  Administered 2014-01-20: 1 via TOPICAL
  Filled 2014-01-19: qty 56

## 2014-01-19 MED ORDER — GENTAMICIN SULFATE 40 MG/ML IJ SOLN
160.0000 mg | Freq: Once | INTRAVENOUS | Status: AC
  Administered 2014-01-19: 160 mg via INTRAVENOUS
  Filled 2014-01-19: qty 4

## 2014-01-19 MED ORDER — SODIUM CHLORIDE 0.9 % IV SOLN
2.0000 g | Freq: Four times a day (QID) | INTRAVENOUS | Status: DC
  Administered 2014-01-19: 2 g via INTRAVENOUS
  Filled 2014-01-19 (×3): qty 2000

## 2014-01-19 MED ORDER — DIPHENHYDRAMINE HCL 50 MG/ML IJ SOLN: 12.5000 mg | INTRAMUSCULAR | Status: DC | PRN

## 2014-01-19 NOTE — Progress Notes (Addendum)
Monica Bean is a 28 y.o. G2P1001 at 3871w4d admitted for induction of labor due to cholestasis.  Subjective:  Comfortable, Not feeling contractions very strongly now with the epidural.   Objective: BP 123/80  Pulse 128  Temp(Src) 99.7 F (37.6 C) (Axillary)  Resp 18  Ht 5\' 2"  (1.575 m)  Wt 75.751 kg (167 lb)  BMI 30.54 kg/m2      FHT:  Baseline 150s, + accels, moderate variability, early decel, Category I tracing UC:   regular, every 2 minutes SVE:   Dilation: 10 Effacement (%): 80 Station: +2 Exam by:: hk  Labs: Lab Results  Component Value Date   WBC 13.6* 01/18/2014   HGB 13.3 01/18/2014   HCT 38.5 01/18/2014   MCV 88.7 01/18/2014   PLT 261 01/18/2014    Assessment / Plan: IOL due to cholestasis  Labor: regular contractions on Pitocin. Fully dilated.  After epidural not feleling urge to push. Will sit up vertically.  Fetal Wellbeing:  Category I Pain Control:  Epidural I/D:  n/a Anticipated MOD:  NSVD  Monica Bean 01/19/2014, 1:24 PM

## 2014-01-19 NOTE — Progress Notes (Signed)
Patient up to stedy to check bleeding. Patient reports feeling dizzy, nauseous and light-headed. BP taken, another RN assisted to take her back to bed. Bolus of IV pitocin in LR given. Patient reports she wants to sleep and is unable to move well. Given PO cytotec per Drenda FreezeFran CNM order. Transferred to room 310 via stretcher.

## 2014-01-19 NOTE — Progress Notes (Signed)
Monica Bean is a 28 y.o. G2P1001 at 7768w4d admitted for induction of labor due to cholestasis.  Subjective:  Starting to be more uncomfortable. FB just fell out.  +FM.  No LOF  Objective: BP 119/76  Pulse 92  Temp(Src) 98.4 F (36.9 C) (Oral)  Resp 18  Ht 5\' 2"  (1.575 m)  Wt 75.751 kg (167 lb)  BMI 30.54 kg/m2      FHT:  FHR: 145 bpm, variability: moderate,  accelerations:  Present,  decelerations:  Absent UC:   Irregular every 5-778min SVE:   Dilation: 5 Effacement (%): 70 Station: -1 Exam by:: cwicker,rnc  Labs: Lab Results  Component Value Date   WBC 13.6* 01/18/2014   HGB 13.3 01/18/2014   HCT 38.5 01/18/2014   MCV 88.7 01/18/2014   PLT 261 01/18/2014    Assessment / Plan: IOL due to cholestasis.   Labor: s/p FB. will start pit 2x2 Fetal Wellbeing:  Category I Pain Control:  Fentanyl I/D:  n/a Anticipated MOD:  NSVD  Monica Bean 01/19/2014, 12:22 AM

## 2014-01-19 NOTE — Progress Notes (Signed)
ANTIBIOTIC CONSULT NOTE - INITIAL  Pharmacy Consult for Gentamicin Indication: Chorioamnionitis   No Known Allergies  Patient Measurements: Height: 5\' 2"  (157.5 cm) Weight: 167 lb (75.751 kg) IBW/kg (Calculated) : 50.1 Adjusted Body Weight: 58 kg  Vital Signs: Temp: 101.5 F (38.6 C) (04/23 1414) Temp src: Oral (04/23 1414) BP: 138/114 mmHg (04/23 1400) Pulse Rate: 123 (04/23 1400)  Labs:  Recent Labs  01/18/14 1950  WBC 13.6*  HGB 13.3  PLT 261    Medications:  Ampicillin 2g IV q6h  Assessment: 28 y.o. female G2P1001 at 264w4d being initiated on ampicillin and gentamicin for maternal fever/chorio.   Estimated Ke = 0.343, Vd = 20L  Goal of Therapy:  Gentamicin peak 6-8 mg/L and Trough < 1 mg/L  Plan:  Gentamicin 160 mg IV x 1  Gentamicin 140 mg IV every 8 hrs  Check Scr with next labs if gentamicin continued. Will check gentamicin levels if continued > 72hr or clinically indicated.  Johnel Yielding [place] Adrionna Delcid 01/19/2014,2:54 PM

## 2014-01-19 NOTE — Progress Notes (Signed)
Dara LordsGanga Garza is a 28 y.o. G2P1001 at 6879w4d admitted for induction of labor due to cholestasis.  Subjective:  Doing well. Sleeping through contractions. +FM.   Objective: BP 117/80  Pulse 94  Temp(Src) 98 F (36.7 C) (Oral)  Resp 16  Ht 5\' 2"  (1.575 m)  Wt 75.751 kg (167 lb)  BMI 30.54 kg/m2      FHT:  FHR: 145 bpm, variability: moderate,  accelerations:  Present,  decelerations:  Absent UC:   regular, every 2-3 minutes SVE:   Dilation: 5 Effacement (%): 70 Station: -1 Exam by:: cwicker,rnc  Labs: Lab Results  Component Value Date   WBC 13.6* 01/18/2014   HGB 13.3 01/18/2014   HCT 38.5 01/18/2014   MCV 88.7 01/18/2014   PLT 261 01/18/2014    Assessment / Plan: IOL due to cholestasis  Labor: now on pit. will cont to increase and when in good pattern with discomfort, plan to AROM  Fetal Wellbeing:  Category I Pain Control:  Fentanyl I/D:  n/a Anticipated MOD:  NSVD  Brisia Schuermann L Genelle Economou 01/19/2014, 3:29 AM

## 2014-01-19 NOTE — Progress Notes (Signed)
Monica Bean is a 28 y.o. G2P1001 at 3860w4d admitted for induction of labor due to cholestasis.  Subjective:  Sleeping. Not very uncomfortable.    Objective: BP 118/81  Pulse 86  Temp(Src) 98 F (36.7 C) (Oral)  Resp 18  Ht 5\' 2"  (1.575 m)  Wt 75.751 kg (167 lb)  BMI 30.54 kg/m2      FHT:  FHR: 135 bpm, variability: moderate,  accelerations:  Present,  decelerations:  Absent UC:   regular, every 1-3 minutes SVE:   Dilation: 4 Effacement (%): 80 Station: -1 Exam by:: Lemoine Goyne md  Labs: Lab Results  Component Value Date   WBC 13.6* 01/18/2014   HGB 13.3 01/18/2014   HCT 38.5 01/18/2014   MCV 88.7 01/18/2014   PLT 261 01/18/2014    Assessment / Plan: IOL due to cholestasis  Labor: pit has not been increased recently due to frequency of contractions but pt comfortable.  AROM performed with return of clear fluid and IUPC placed without difficulty.   Fetal Wellbeing:  Category I Pain Control:  Fentanyl I/D:  n/a Anticipated MOD:  NSVD  Kaylany Tesoriero L Bryah Ocheltree 01/19/2014, 6:26 AM

## 2014-01-19 NOTE — Anesthesia Procedure Notes (Signed)
Epidural Patient location during procedure: OB Start time: 01/19/2014 7:23 AM End time: 01/19/2014 7:27 AM  Staffing Anesthesiologist: Leilani AbleHATCHETT, Kenzo Ozment Performed by: anesthesiologist   Preanesthetic Checklist Completed: patient identified, surgical consent, pre-op evaluation, timeout performed, IV checked, risks and benefits discussed and monitors and equipment checked  Epidural Patient position: sitting Prep: site prepped and draped and DuraPrep Patient monitoring: continuous pulse ox and blood pressure Approach: midline Location: L3-L4 Injection technique: LOR air  Needle:  Needle type: Tuohy  Needle gauge: 17 G Needle length: 9 cm and 9 Needle insertion depth: 6 cm Catheter type: closed end flexible Catheter size: 19 Gauge Catheter at skin depth: 11 cm Test dose: negative and Other  Assessment Sensory level: T9 Events: blood not aspirated, injection not painful, no injection resistance, negative IV test and no paresthesia  Additional Notes Reason for block:procedure for pain

## 2014-01-19 NOTE — Progress Notes (Signed)
Reported maternal axillary temp of 101.6 and heart rate in the 140's. Ordered to give tylenol and ibuprofen.

## 2014-01-19 NOTE — Anesthesia Preprocedure Evaluation (Signed)

## 2014-01-19 NOTE — Progress Notes (Signed)
I have seen this patient and agree with the above resident's note.  LEFTWICH-KIRBY, LISA Certified Nurse-Midwife 

## 2014-01-20 ENCOUNTER — Encounter (HOSPITAL_COMMUNITY): Payer: Self-pay

## 2014-01-20 NOTE — Progress Notes (Signed)
UR completed 

## 2014-01-20 NOTE — Progress Notes (Signed)
Post Partum Day 1 Subjective: up ad lib, voiding, tolerating PO and + flatus. Initially some difficulty voiding requiring in & out cath but has been up to urinate twice this morning. Last fever 101.6 at 19:15  Objective: Blood pressure 104/66, pulse 90, temperature 97.5 F (36.4 C), temperature source Oral, resp. rate 18, height 5\' 2"  (1.575 m), weight 75.751 kg (167 lb), SpO2 98.00%, unknown if currently breastfeeding.  Physical Exam:  General: alert Lochia: appropriate Uterine Fundus: firm. Abdomen is tender. Incision: NA DVT Evaluation: No evidence of DVT seen on physical exam. Negative Homan's sign.   Recent Labs  01/18/14 1950  HGB 13.3  HCT 38.5    Assessment/Plan: 28 yo G2 now P2, s/p VAVD yesterday with intrapartum fever, received Ampicillin & Gentamicin.Baby is in NICU, Bottlefeeding. Patient is doing well this morning. No new fevers, off antibiotics since delivery.  Abdomen is a bit tender, will monitor closely. Lochia is appropriate. Voiding has improved.  1. Monitor temperature curve 2. Possible d/c tomorrow if continues stable   LOS: 2 days   Myriam JacobsonRobyn H Restrepo 01/20/2014, 7:23 AM   I spoke with and examined patient and agree with resident's note and plan of care.  Tawana ScaleMichael Ryan Neysa Arts, MD OB Fellow 01/20/2014 9:43 AM

## 2014-01-20 NOTE — Progress Notes (Signed)
Upon receiving patient from L&D around 2130,  fundus was shifted toward the right. Patient was encouraged to get up and void, but was unsuccessful. Per report from L&D RN  Patient was I&O cath around 2000. Bladder scan was done around 2330 and showed 320cc in the bladder. Patient stated no urge to void at this time, but attempted to void anyway, and was unsuccessful. Around 0100 went to reasses patient's need to void and patient was still unable to void. At this time pitocin was turned off and I&O cath performed d/t abdomen distention and patient approaching 6 hours since last I&O cath. I&O cath done around 0130 with 300cc of amber urine resulting. Patient reports "some relief". Fundus still slightly shifted towards R side. Will reassess patient at 0245 on four hour check.

## 2014-01-20 NOTE — Plan of Care (Signed)
Problem: Phase II Progression Outcomes Goal: Pain controlled on oral analgesia Outcome: Completed/Met Date Met:  01/20/14 Good pain control on PO Motrin. Goal: Progress activity as tolerated unless otherwise ordered Outcome: Completed/Met Date Met:  01/20/14 Up in room and hall and tolerates very well. Goal: Other Phase II Outcomes/Goals Outcome: Completed/Met Date Met:  01/20/14 Voiding without hesitancy or retention.  Problem: Discharge Progression Outcomes Goal: Tolerating diet Outcome: Completed/Met Date Met:  01/20/14 Tolerates Regular diet well. Goal: Complications resolved/controlled Outcome: Completed/Met Date Met:  01/20/14 Voiding without difficulty.

## 2014-01-21 MED ORDER — IBUPROFEN 600 MG PO TABS: 600.0000 mg | ORAL_TABLET | Freq: Four times a day (QID) | ORAL | Status: AC

## 2014-01-21 NOTE — Discharge Instructions (Signed)

## 2014-01-21 NOTE — Discharge Summary (Signed)
Obstetric Discharge Summary Reason for Admission: induction of labor cholestasis, TOLAC (hx prior scheduled c/s 38 wk for pre-eclampsia) Prenatal Procedures: NST Intrapartum Procedures: vacuum delivery Postpartum Procedures: none Complications-Operative and Postpartum: 3rd degree perineal laceration, s/p repair Hemoglobin  Date Value Ref Range Status  01/18/2014 13.3  12.0 - 15.0 g/dL Final     HCT  Date Value Ref Range Status  01/18/2014 38.5  36.0 - 46.0 % Final    Discharge Diagnoses: Term Pregnancy-delivered (vacuum assisted), IOL d/t Cholestasis  Hospital Course:  Monica Bean is a 28 y.o. [id number] who presented for IOL due to Cholestasis, on admission labs bile salts 21, AST 39, ALT 65.  Due to intrapartum fever, received Ampicillin / Gent IV antibiotics. She had a complicated vaginal delivery requiring Vacuum Assistance to a viable baby girl, to the NICU. The patient was able to ambulate, tolerate PO and void normally. She was discharged home with instructions for postpartum care.    Delivery  01/19/2014 6:13 PM by VBAC, Vacuum Assisted  Sex: female Gestational Age: <None>  Delivery Clinician: Adam PhenixJames G Arnold  Living?: Yes  APGARS  One minute  Five minutes  Ten minutes   Skin color:  0  1    Heart rate:  2  2    Grimace:  1  1    Muscle tone:  0  1    Breathing:  0  2    Totals:  3  7    Presentation/position: Vertex Left Occiput Anterior  Resuscitation:  See NICU Consult (infant's chart)   Cord information: 3 vessels Disposition of cord blood: No Blood gases sent? Yes  Complications:  None   Placenta: Delivered: 01/19/2014 6:47 PM Spontaneous Intact appearance  Newborn Measurements:  Weight: Height: 23.23" Head circumference: Chest circumference:  Other providers:  Delivery Nurse  Registered Nurse  Registered Nurse  Registered Nurse  Technician  Tobias AlexanderHeather Jensen Krietemeyer  Melissa A Wilkins  Doloris Hallhristine Marie Soliz  Shannon Kaley Earl  Audrey Elaine Edwards    Additional information:  Forceps:    Vacuum:  Outlet   Breech:    Observed anomalies     Kiwi vacuum, 2 pop-off. 3rd degree laceration repair 3-0 vicryl. Patient counseled prior to vacuum attempt and gave verbal consent.  Weight 8 lb 11.3 oz   Physical Exam:  General: alert and cooperative Lochia: appropriate Uterine Fundus: firm DVT Evaluation: No evidence of DVT seen on physical exam. Negative Homan's sign. No cords or calf tenderness. No significant calf/ankle edema.  Discharge Information: Date: 01/21/2014 Activity: pelvic rest Diet: routine Medications: PNV and Ibuprofen Baby feeding: plans to bottle feed Contraception: Nexplanon Condition: stable Instructions: refer to practice specific booklet Discharge to: home Follow-up Information   [facility] [place]. Schedule an appointment as soon as possible for a visit in 1 week.   Contact information:   [address] [place] [id number] [phone]      Newborn Data: Live born female  Birth Weight:  APGAR: 3, 7  Continues to remain in NICU.  Saralyn PilarAlexander Karamalegos, DO [facility], PGY-1  01/21/2014, 9:15 AM  I spoke with and examined patient and agree with resident's note and plan of care.  Tawana ScaleMichael Ryan Azrael Huss, MD OB Fellow 01/21/2014 11:01 AM

## 2014-01-21 NOTE — Progress Notes (Signed)
Discharge instructions reviewed with patient.  Patient states understanding of home care.  No home equipment needed.  Patient ambulated for discharge in stable condition with staff without incident. 

## 2014-01-21 NOTE — Discharge Summary (Signed)
Attestation of Attending Supervision of Obstetric Fellow: Evaluation and management procedures were performed by the Obstetric Fellow under my supervision and collaboration.  I have reviewed the Obstetric Fellow's note and chart, and I agree with the management and plan.  Roya Gieselman, MD, FACOG Attending Obstetrician & Gynecologist Faculty Practice, [facility]   

## 2014-01-21 NOTE — Anesthesia Postprocedure Evaluation (Signed)
  Anesthesia Post-op Note  Anesthesia Post Note  Patient: Monica Bean  Procedure(s) Performed: * No procedures listed *  Anesthesia type: Epidural  Patient location: [facility] Unit  Post pain: Pain level controlled  Post assessment: Post-op Vital signs reviewed  Last Vitals:  Filed Vitals:   01/21/14 0543  BP: 92/52  Pulse: 96  Temp: 36.4 C  Resp: 16    Post vital signs: Reviewed  Level of consciousness:alert  Complications: No apparent anesthesia complications

## 2014-01-21 NOTE — Lactation Note (Signed)
This note was copied from the chart of Monica Bean. Lactation Consultation Note    Follow up consult with this mom of a term NICU baby., now 42 hours post partum, and mom being discharged to home today. On exam ,  I showed mom how to had express, and she collected a few drops. I also reviewed part care, labeling, and milk strorge and transport. [facility] is active with [place], [facility]. [facility] I loaned her a DEP and instructed her in it/s use,  Mom encouraged to pump 8 times a day, even after breast feeding her baby in the nICU. I will follow this family in the nICU.  Patient Name: Monica Dara LordsGanga Bader ZOXWR'UToday's Date: 01/21/2014 Reason for consult: Follow-up assessment;NICU baby   Maternal Data    Feeding Feeding Type: Formula Nipple Type: Slow - flow Length of feed: 20 min  LATCH Score/Interventions                      Lactation Tools Discussed/Used WIC Program: Yes Pump Review: Setup, frequency, and cleaning   Consult Status Consult Status: PRN Follow-up type: In-patient (in NICU)    Alfred LevinsChristine Anne Alba Perillo 01/21/2014, 12:48 PM

## 2014-01-24 ENCOUNTER — Ambulatory Visit: Payer: Self-pay

## 2014-01-24 NOTE — Lactation Note (Signed)
This note was copied from the chart of Monica Bean. Lactation Consultation Note     Follow up consult with this mom and term NICU baby, being discharged to home today. Mom reports breast feeding going well, and she has a good milk supply. She has to return to work in 3 weeks, and plans to breast feed exclusively until then, and then breast feed when home. She will be working 8-10 hours a day. I advised mom to call her boss prior to retuning to work , and see if she is going to be able to have time to pump  While at work. If not, I suggested she begin adding some formula during the day, for a week before retuning, to wean her supply during the day some before going back to work. I also advised mom to call lactation for any questions/concerns. Mom breast fed her first child for 2 1/2 years.  Patient Name: Monica Dara LordsGanga Regan JXBJY'NToday's Date: 01/24/2014 Reason for consult: Follow-up assessment;NICU baby   Maternal Data    Feeding Feeding Type: Formula Nipple Type: Regular Length of feed: 20 min  LATCH Score/Interventions                      Lactation Tools Discussed/Used     Consult Status Consult Status: Complete Follow-up type: Call as needed    Alfred LevinsChristine Anne Lynn Recendiz 01/24/2014, 12:04 PM

## 2014-01-25 ENCOUNTER — Encounter (HOSPITAL_COMMUNITY): Payer: Self-pay

## 2014-01-26 NOTE — MAU Provider Note (Signed)
Attestation of Attending Supervision of Advanced Practitioner (CNM/NP): Evaluation and management procedures were performed by the Advanced Practitioner under my supervision and collaboration. I have reviewed the Advanced Practitioner's note and chart, and I agree with the management and plan.  Lesly DukesKelly H Jaeleigh Monaco 5:39 PM

## 2014-01-29 NOTE — H&P (Signed)
`````  Attestation of Attending Supervision of Advanced Practitioner: Evaluation and management procedures were performed by the PA/NP/CNM/OB Fellow under my supervision/collaboration. Chart reviewed and agree with management and plan.  Tilda BurrowJohn V Shawntelle Ungar 01/29/2014 12:37 AM

## 2014-07-31 ENCOUNTER — Encounter (HOSPITAL_COMMUNITY): Payer: Self-pay

## 2014-10-13 IMAGING — US US ABDOMEN COMPLETE
1 series · 14 of 25 positions shown · non-contrast
Comparison: None.

CLINICAL DATA: Right upper quadrant pain.

EXAM:
ULTRASOUND ABDOMEN COMPLETE

[Series 1: us abdomen complete · 14 of 85 slices shown]
[im 1/85]
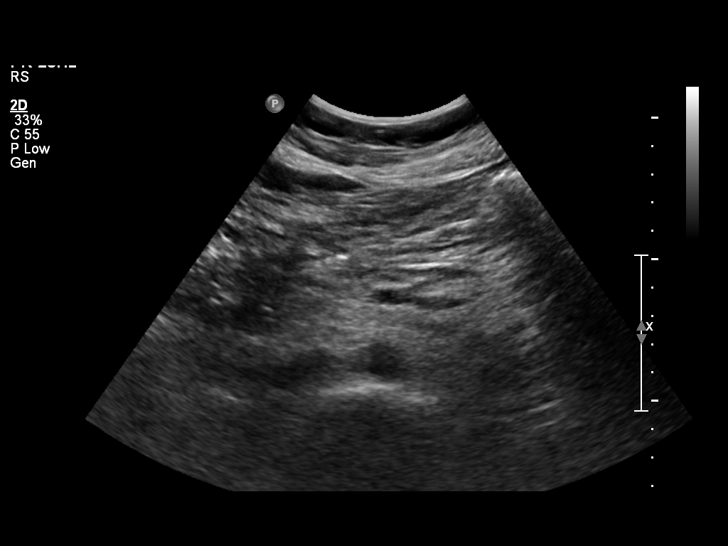
[im 8/85]
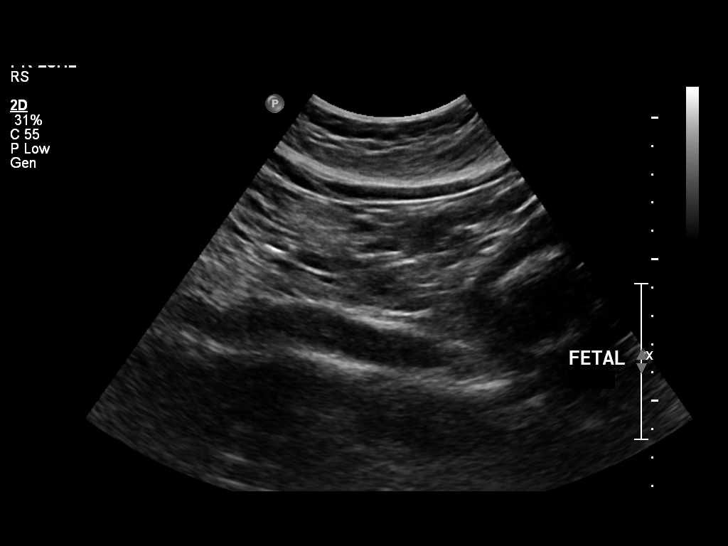
[im 15/85]
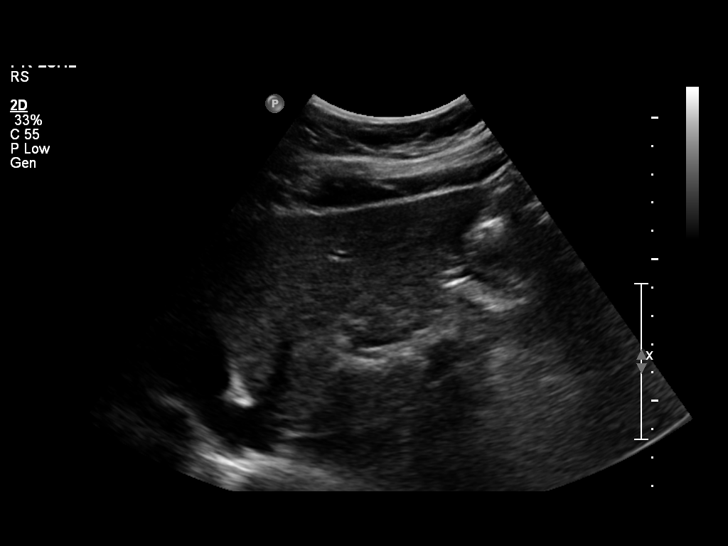
[im 22/85]
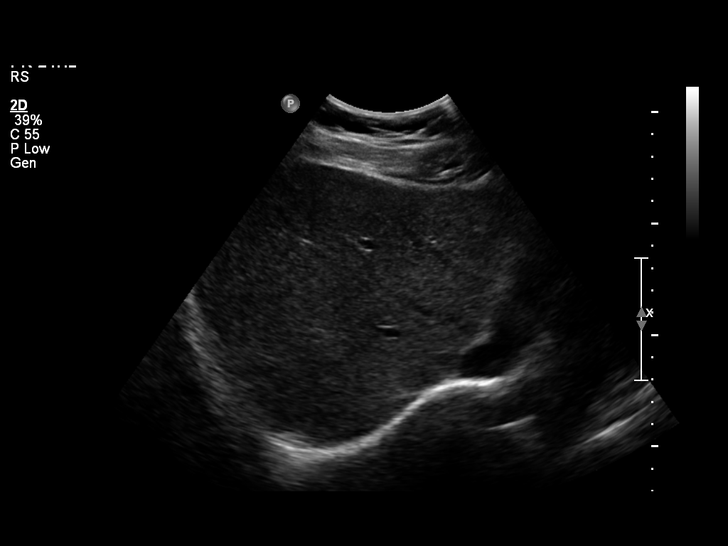
[im 29/85]
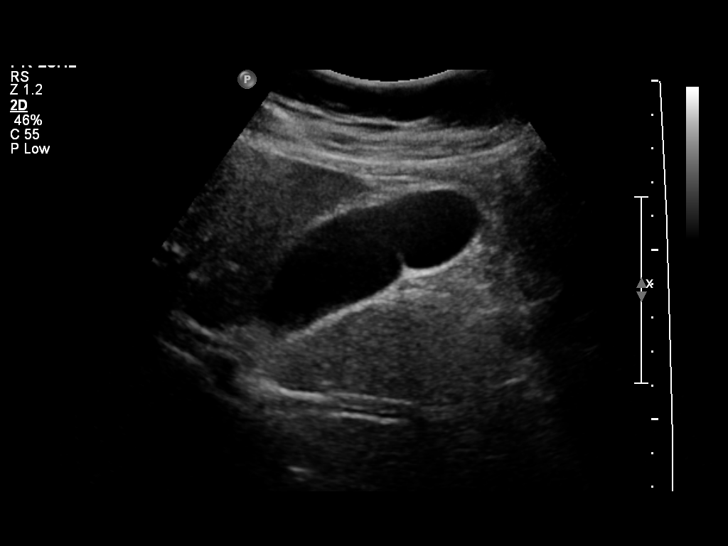
[im 32/85]
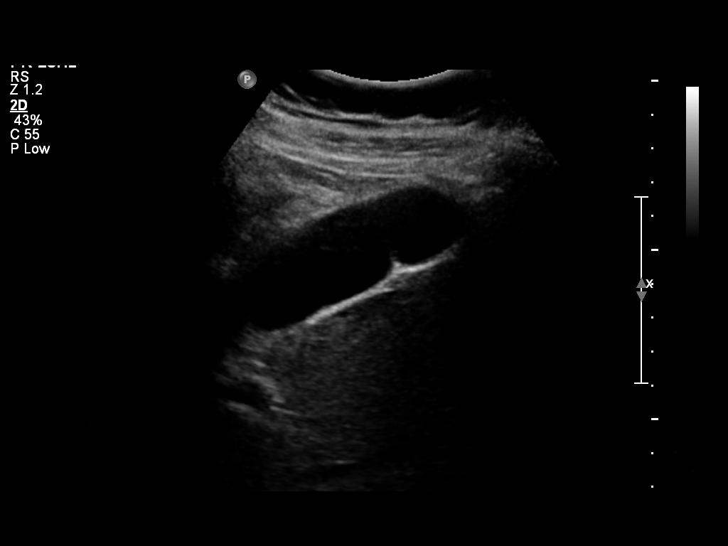
[im 39/85]
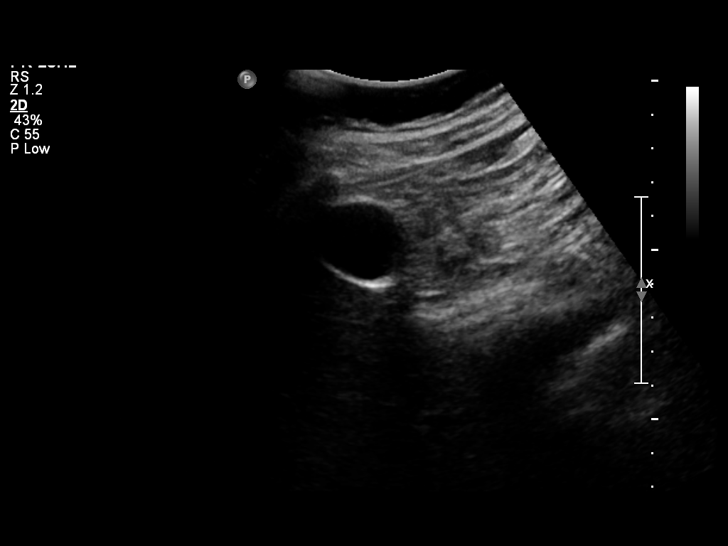
[im 46/85]
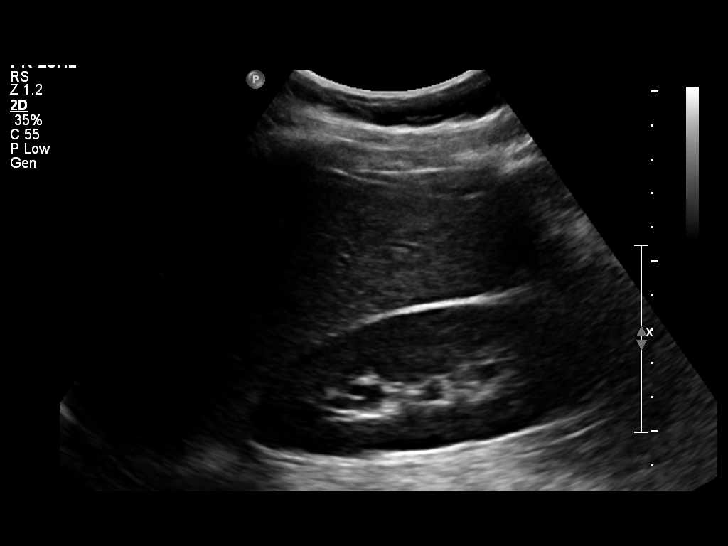
[im 53/85]
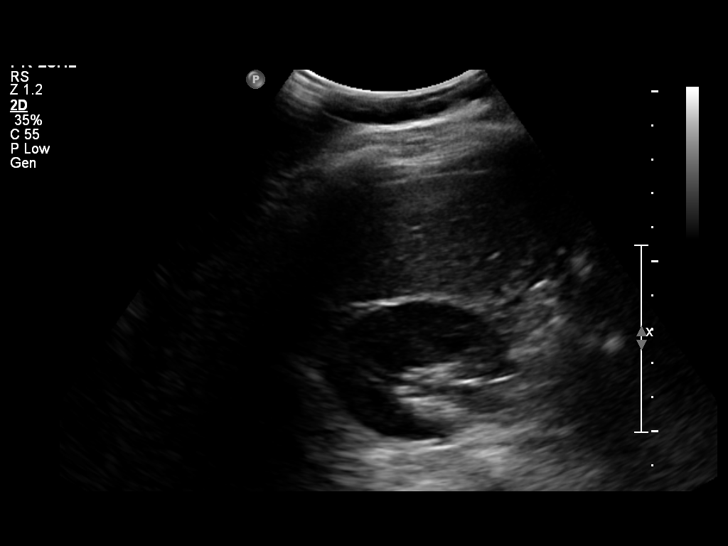
[im 57/85]
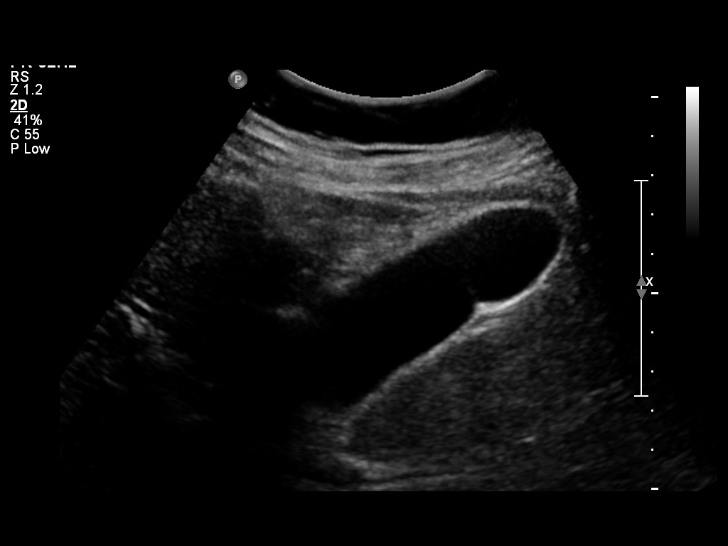
[im 64/85]
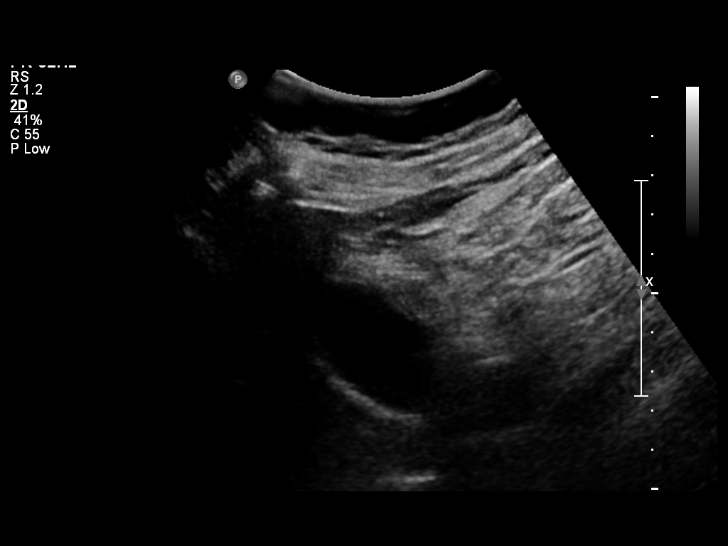
[im 71/85]
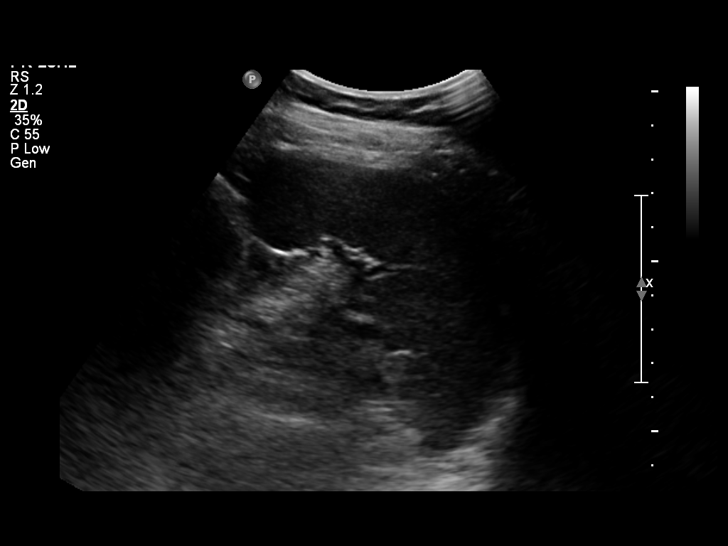
[im 78/85]
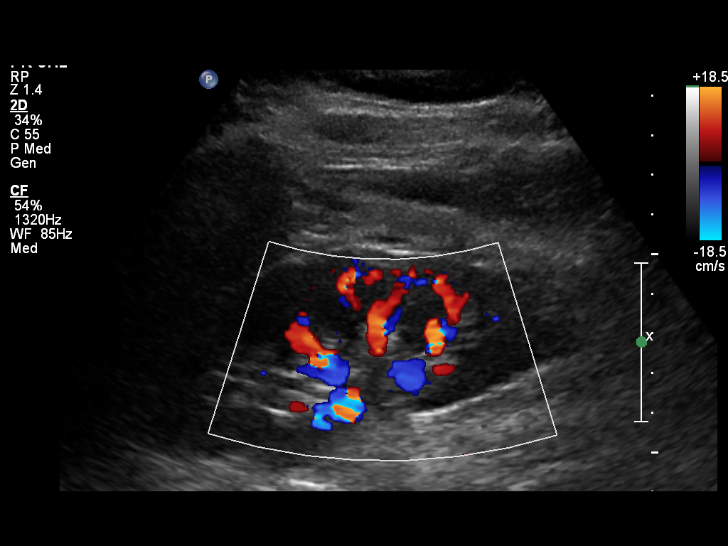
[im 85/85]
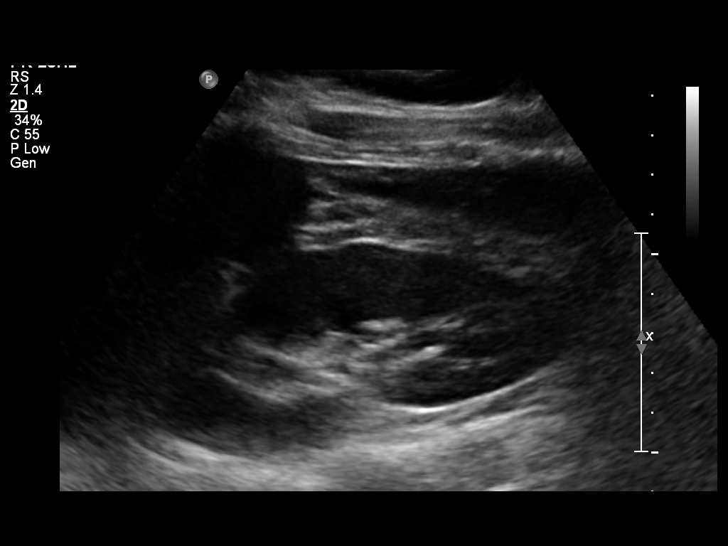

[14 of 25 positions shown; findings below may reference images not displayed]

FINDINGS: Gallbladder

No gallstones or wall thickening visualized. No sonographic Murphy
sign noted.

Common bile duct

Diameter: 2.7 mm.

Liver

No focal lesion identified. Within normal limits in parenchymal
echogenicity.

IVC

No abnormality visualized.

Pancreas

Visualized portion unremarkable.

Spleen

Size and appearance within normal limits.

Right Kidney

Length: 10.8 cm.. Echogenicity within normal limits. No mass or
hydronephrosis visualized.

Left Kidney

Length: 10.3 cm.. Echogenicity within normal limits. No mass or
hydronephrosis visualized.

Abdominal aorta

No aneurysm visualized.
IMPRESSION: Normal exam.

## 2014-10-28 IMAGING — US US OB FOLLOW-UP
1 series · 12 of 28 positions shown · non-contrast
Comparison: none

[Series 1: us ob follow up · 94 acquisitions, 12 frames shown]
[im 4/94]
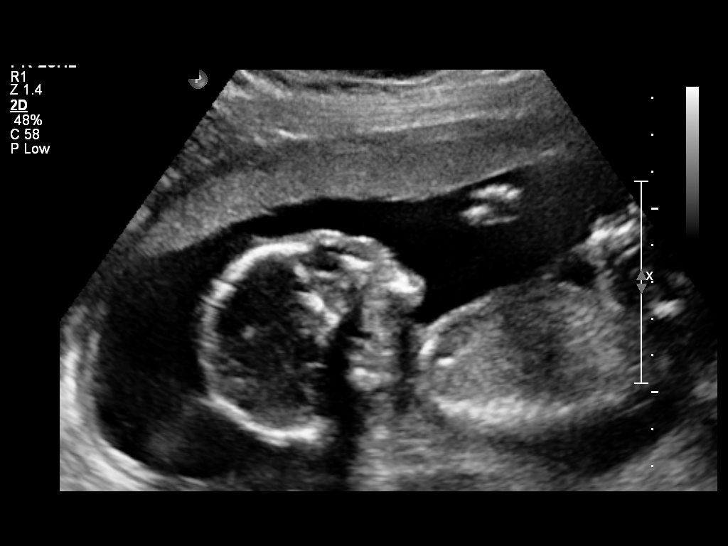
[im 11/94]
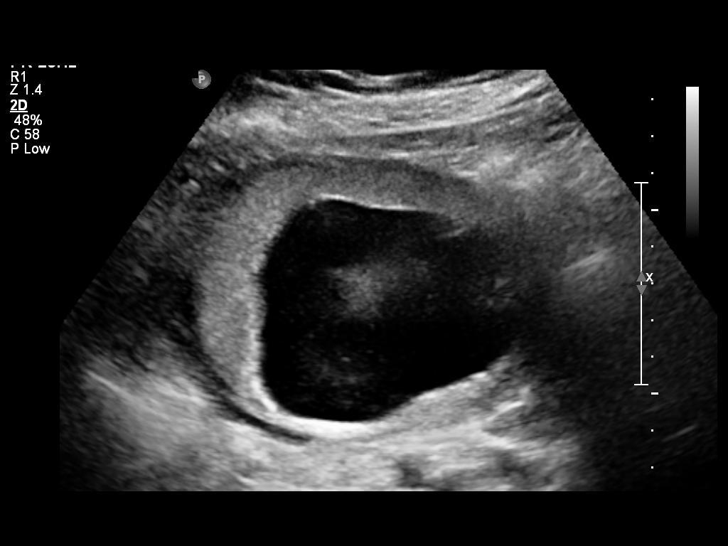
[im 18/94]
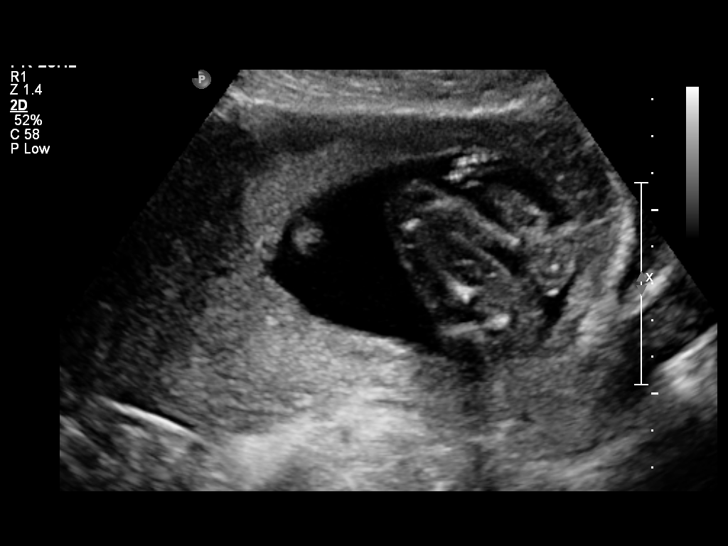
[im 28/94]
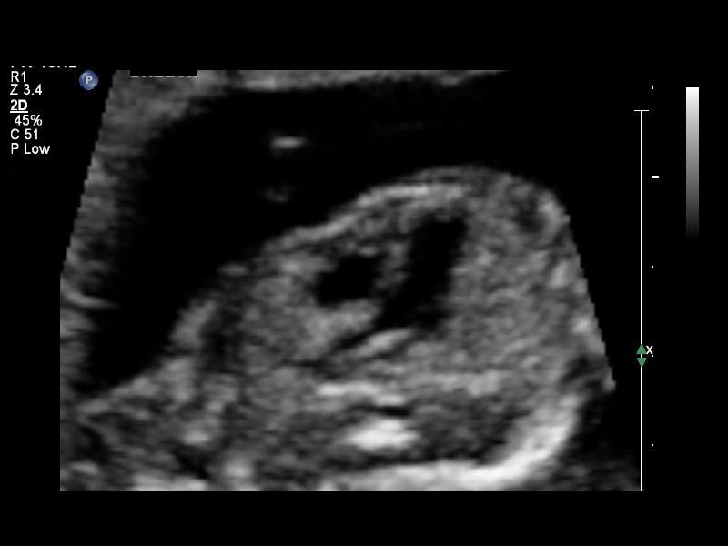
[im 35/94]
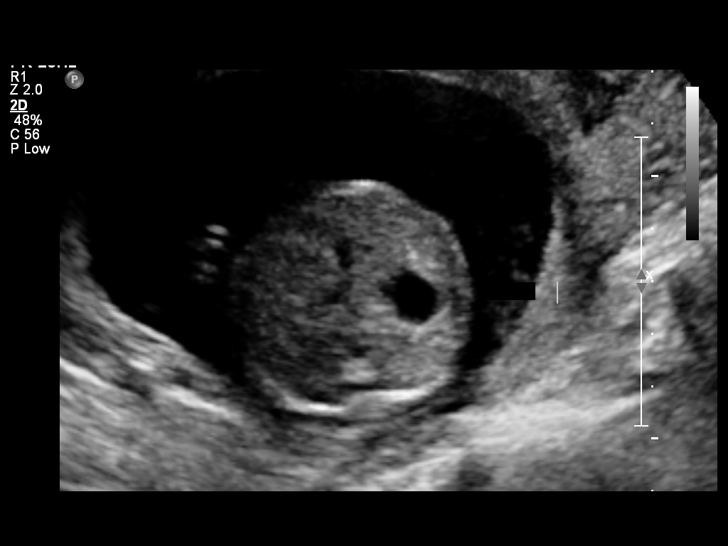
[im 42/94]
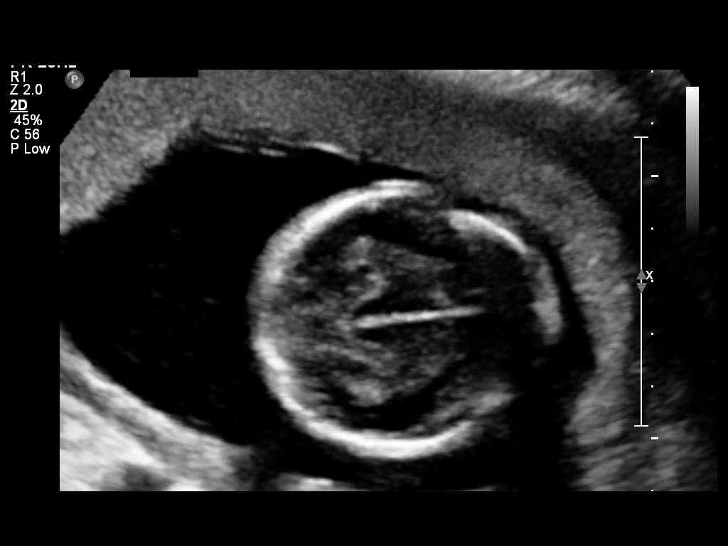
[im 52/94]
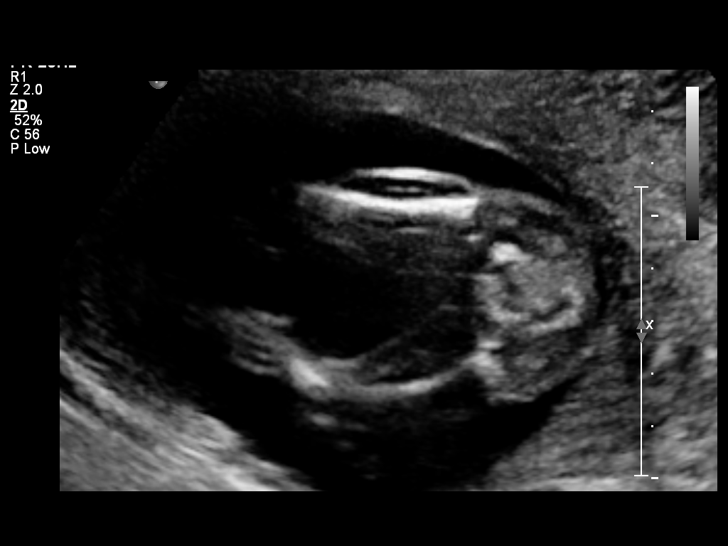
[im 59/94]
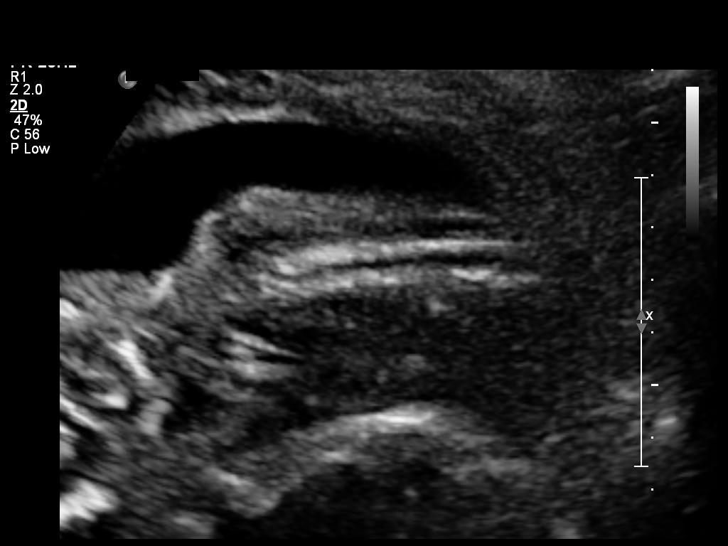
[im 66/94]
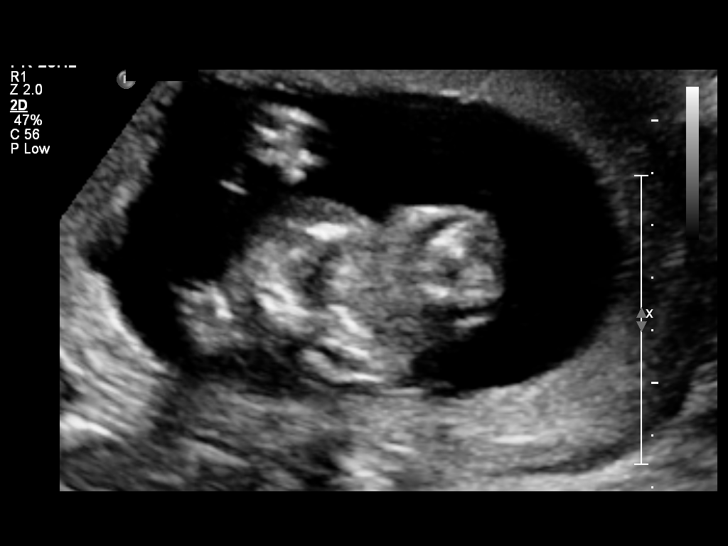
[im 76/94]
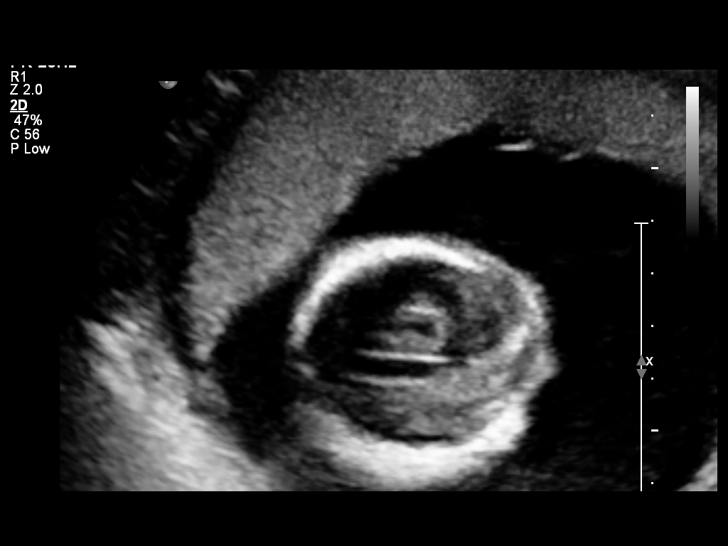
[im 83/94]
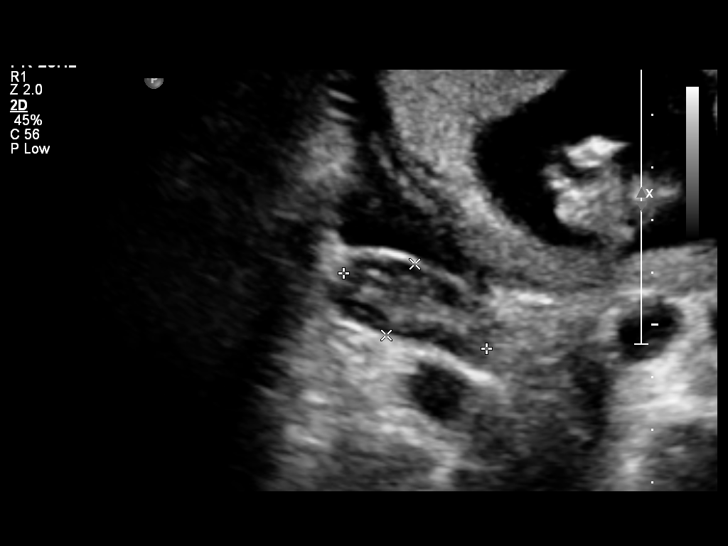
[im 90/94]
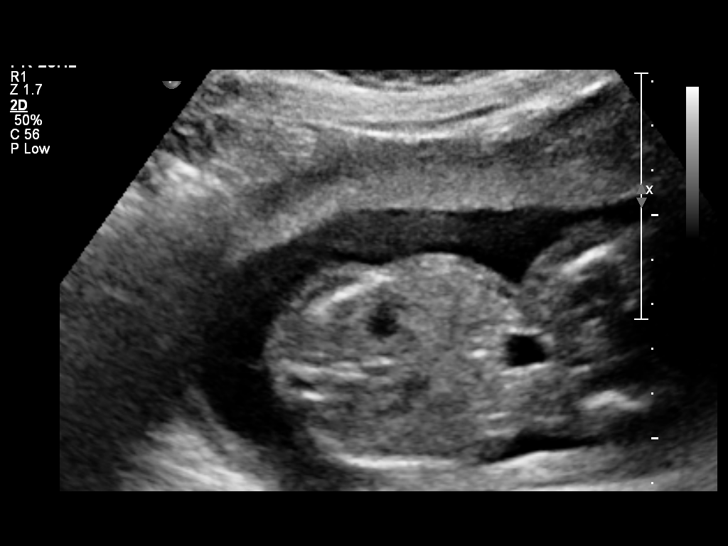

[12 of 28 positions shown; findings below may reference images not displayed]

OBSTETRICS REPORT
                      (Signed Final 08/26/2013 [DATE])

Service(s) Provided

 US OB FOLLOW UP                                       76816.1
Indications

 Follow-up incomplete fetal anatomic evaluation
 Size greater than dates (Large for gestational [AGE]
 Previous cesarean section
 Poor obstetric history: Previous preeclampsia
Fetal Evaluation

 Num Of Fetuses:    1
 Fetal Heart Rate:  155                          bpm
 Cardiac Activity:  Observed
 Presentation:      Breech
 Placenta:          Anterior RT, above
                    cervical os
 P. Cord            Visualized, central
 Insertion:

 Amniotic Fluid
 AFI FV:      Subjectively within normal limits
                                             Larg Pckt:     5.2  cm
Biometry

 BPD:     47.6  mm     G. Age:  20w 3d                CI:        80.99   70 - 86
                                                      FL/HC:      18.0   16.8 -

 HC:       167  mm     G. Age:  19w 3d       27  %    HC/AC:      1.17   1.09 -

 AC:       143  mm     G. Age:  19w 4d       43  %    FL/BPD:
 FL:        30  mm     G. Age:  19w 2d       27  %    FL/AC:      21.0   20 - 24
 HUM:     30.1  mm     G. Age:  20w 0d       58  %
 CER:     20.7  mm     G. Age:  19w 5d       50  %
 NFT:     3.18  mm

 Est. FW:     296  gm    0 lb 10 oz      44  %
Gestational Age

 LMP:           18w 0d        Date:  04/22/13                 EDD:   01/27/14
 U/S Today:     19w 5d                                        EDD:   01/15/14
 Best:          19w 5d     Det. By:  U/S (07/25/13)           EDD:   01/15/14
Anatomy

 Cranium:          Appears normal         Aortic Arch:      Not well visualized
 Fetal Cavum:      Appears normal         Ductal Arch:      Appears normal
 Ventricles:       Appears normal         Diaphragm:        Appears normal
 Choroid Plexus:   Left choroid           Stomach:          Appears normal
                   plexus cyst,
                   mm
 Cerebellum:       Appears normal         Abdomen:          Appears normal
 Posterior Fossa:  Appears normal         Abdominal Wall:   Appears nml (cord
                                                            insert, abd wall)
 Nuchal Fold:      Appears normal         Cord Vessels:     Appears normal (3
                                                            vessel cord)
 Face:             Appears normal         Kidneys:          Appear normal
                   (orbits and profile)
 Lips:             Appears normal         Bladder:          Appears normal
 Heart:            Appears normal         Spine:            Appears normal
                   (4CH, axis, and
                   situs)
 RVOT:             Appears normal         Lower             Appears normal
                                          Extremities:
 LVOT:             Appears normal         Upper             Appears normal
                                          Extremities:

 Other:  Female gender. Technically difficult due to fetal position.
Cervix Uterus Adnexa

 Cervical Length:    4.2      cm

 Cervix:       Normal appearance by transabdominal scan.

 Left Ovary:    Previously seen.
 Right Ovary:   Within normal limits.
Comments

 Choroid plexus cysts were noted.  This is a common variant
 (1-2%) of all pregnancies, and carries a small association
 with Trisomy 18.  However, there were no other ultrasound
 stigmata of Trisomy 18, making the possibility of Trisomy 18
 less likely.
Impression

 Single IUP at 19 [DATE] weeks
 Isolated left choroid plexus cyst noted
 The remainder of the fetal anatomy was within normal limits
 No other markers associated with aneuploidy were seen
 Open hands were visualized
 Normal amniotic fluid volume
Recommendations

 Follow-up ultrasounds as clinically indicated.

 questions or concerns.
